# Patient Record
Sex: Female | Born: 1990 | Hispanic: Yes | Smoking: Never smoker
Health system: Southern US, Community
[De-identification: ages and names within clinical notes are randomized; demographics above are authoritative.]

## PROBLEM LIST (undated history)

## (undated) DIAGNOSIS — D649 Anemia, unspecified: Secondary | ICD-10-CM

## (undated) DIAGNOSIS — N840 Polyp of corpus uteri: Secondary | ICD-10-CM

## (undated) HISTORY — PX: NO PAST SURGERIES: SHX2092

---

## 2018-08-10 DIAGNOSIS — Z6834 Body mass index (BMI) 34.0-34.9, adult: Secondary | ICD-10-CM | POA: Diagnosis not present

## 2018-08-10 DIAGNOSIS — N926 Irregular menstruation, unspecified: Secondary | ICD-10-CM | POA: Diagnosis not present

## 2018-08-18 DIAGNOSIS — N939 Abnormal uterine and vaginal bleeding, unspecified: Secondary | ICD-10-CM | POA: Diagnosis not present

## 2018-08-18 DIAGNOSIS — R9389 Abnormal findings on diagnostic imaging of other specified body structures: Secondary | ICD-10-CM | POA: Diagnosis not present

## 2018-08-29 DIAGNOSIS — R9389 Abnormal findings on diagnostic imaging of other specified body structures: Secondary | ICD-10-CM | POA: Diagnosis not present

## 2018-08-29 DIAGNOSIS — Z3202 Encounter for pregnancy test, result negative: Secondary | ICD-10-CM | POA: Diagnosis not present

## 2019-05-16 ENCOUNTER — Other Ambulatory Visit: Payer: Self-pay | Admitting: Obstetrics and Gynecology

## 2019-05-16 DIAGNOSIS — D649 Anemia, unspecified: Secondary | ICD-10-CM | POA: Diagnosis not present

## 2019-05-30 ENCOUNTER — Other Ambulatory Visit (HOSPITAL_COMMUNITY)
Admission: RE | Admit: 2019-05-30 | Discharge: 2019-05-30 | Disposition: A | Discharge status: Home / Self Care | Payer: BC Managed Care – PPO | Source: Ambulatory Visit | Attending: Obstetrics and Gynecology | Admitting: Obstetrics and Gynecology

## 2019-05-30 DIAGNOSIS — Z01812 Encounter for preprocedural laboratory examination: Secondary | ICD-10-CM | POA: Insufficient documentation

## 2019-05-30 DIAGNOSIS — Z20828 Contact with and (suspected) exposure to other viral communicable diseases: Secondary | ICD-10-CM | POA: Diagnosis not present

## 2019-05-30 LAB — SARS CORONAVIRUS 2 (TAT 6-24 HRS): SARS Coronavirus 2: NEGATIVE

## 2019-06-01 ENCOUNTER — Other Ambulatory Visit: Payer: Self-pay

## 2019-06-01 ENCOUNTER — Encounter (HOSPITAL_BASED_OUTPATIENT_CLINIC_OR_DEPARTMENT_OTHER): Payer: Self-pay | Admitting: *Deleted

## 2019-06-01 NOTE — Progress Notes (Signed)
SPOKE WITH [place], NPO AFTER MIDNIGHT FOOD, CLEAR LIQUIDS FROM MIDNIGHT UNTIL 815 AM THEN NPO. NO MEDS TO TAKE. NEEDS CBC AND SERUM PREGNANCY. HAS SURGERY ORDERS IN Epic. 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

## 2019-06-02 ENCOUNTER — Encounter (HOSPITAL_BASED_OUTPATIENT_CLINIC_OR_DEPARTMENT_OTHER): Payer: Self-pay | Admitting: Anesthesiology

## 2019-06-02 ENCOUNTER — Ambulatory Visit (HOSPITAL_BASED_OUTPATIENT_CLINIC_OR_DEPARTMENT_OTHER): Payer: BC Managed Care – PPO | Admitting: Anesthesiology

## 2019-06-02 ENCOUNTER — Encounter (HOSPITAL_BASED_OUTPATIENT_CLINIC_OR_DEPARTMENT_OTHER)
Admission: RE | Disposition: A | Discharge status: Home / Self Care | Payer: Self-pay | Source: Home / Self Care | Attending: Obstetrics and Gynecology

## 2019-06-02 ENCOUNTER — Ambulatory Visit (HOSPITAL_BASED_OUTPATIENT_CLINIC_OR_DEPARTMENT_OTHER)
Admission: RE | Admit: 2019-06-02 | Discharge: 2019-06-02 | Disposition: A | Discharge status: Home / Self Care | Payer: BC Managed Care – PPO | Attending: Obstetrics and Gynecology | Admitting: Obstetrics and Gynecology

## 2019-06-02 DIAGNOSIS — N920 Excessive and frequent menstruation with regular cycle: Secondary | ICD-10-CM | POA: Diagnosis not present

## 2019-06-02 DIAGNOSIS — N840 Polyp of corpus uteri: Secondary | ICD-10-CM | POA: Diagnosis not present

## 2019-06-02 DIAGNOSIS — Z6836 Body mass index (BMI) 36.0-36.9, adult: Secondary | ICD-10-CM | POA: Insufficient documentation

## 2019-06-02 DIAGNOSIS — N921 Excessive and frequent menstruation with irregular cycle: Secondary | ICD-10-CM | POA: Insufficient documentation

## 2019-06-02 HISTORY — DX: Anemia, unspecified: D64.9

## 2019-06-02 HISTORY — DX: Polyp of corpus uteri: N84.0

## 2019-06-02 HISTORY — PX: DILATATION & CURETTAGE/HYSTEROSCOPY WITH MYOSURE: SHX6511

## 2019-06-02 LAB — CBC
HCT: 37.6 % (ref 36.0–46.0)
Hemoglobin: 11.8 g/dL — ABNORMAL LOW (ref 12.0–15.0)
MCH: 28.1 pg (ref 26.0–34.0)
MCHC: 31.4 g/dL (ref 30.0–36.0)
MCV: 89.5 fL (ref 80.0–100.0)
Platelets: 361 10*3/uL (ref 150–400)
RBC: 4.2 MIL/uL (ref 3.87–5.11)
RDW: 13.3 % (ref 11.5–15.5)
WBC: 7.9 10*3/uL (ref 4.0–10.5)
nRBC: 0 % (ref 0.0–0.2)

## 2019-06-02 LAB — HCG, SERUM, QUALITATIVE: Preg, Serum: NEGATIVE

## 2019-06-02 SURGERY — DILATATION & CURETTAGE/HYSTEROSCOPY WITH MYOSURE
Anesthesia: General

## 2019-06-02 MED ORDER — ACETAMINOPHEN 500 MG PO TABS
ORAL_TABLET | ORAL | Status: AC
  Filled 2019-06-02: qty 2

## 2019-06-02 MED ORDER — ONDANSETRON HCL 4 MG/2ML IJ SOLN
INTRAMUSCULAR | Status: DC | PRN
  Administered 2019-06-02: 4 mg via INTRAVENOUS

## 2019-06-02 MED ORDER — LIDOCAINE 2% (20 MG/ML) 5 ML SYRINGE
INTRAMUSCULAR | Status: AC
  Filled 2019-06-02: qty 5

## 2019-06-02 MED ORDER — LIDOCAINE 2% (20 MG/ML) 5 ML SYRINGE
INTRAMUSCULAR | Status: DC | PRN
  Administered 2019-06-02: 50 mg via INTRAVENOUS

## 2019-06-02 MED ORDER — KETOROLAC TROMETHAMINE 30 MG/ML IJ SOLN
INTRAMUSCULAR | Status: AC
  Filled 2019-06-02: qty 1

## 2019-06-02 MED ORDER — FENTANYL CITRATE (PF) 100 MCG/2ML IJ SOLN
INTRAMUSCULAR | Status: AC
  Filled 2019-06-02: qty 2

## 2019-06-02 MED ORDER — HYDROCODONE-ACETAMINOPHEN 5-325 MG PO TABS: 1.0000 | ORAL_TABLET | Freq: Four times a day (QID) | ORAL | 0 refills | Status: DC | PRN

## 2019-06-02 MED ORDER — WHITE PETROLATUM EX OINT
TOPICAL_OINTMENT | CUTANEOUS | Status: AC
  Filled 2019-06-02: qty 5

## 2019-06-02 MED ORDER — MIDAZOLAM HCL 5 MG/5ML IJ SOLN
INTRAMUSCULAR | Status: DC | PRN
  Administered 2019-06-02: 2 mg via INTRAVENOUS

## 2019-06-02 MED ORDER — PROMETHAZINE HCL 25 MG/ML IJ SOLN
6.2500 mg | INTRAMUSCULAR | Status: DC | PRN
  Filled 2019-06-02: qty 1

## 2019-06-02 MED ORDER — ONDANSETRON HCL 4 MG/2ML IJ SOLN
INTRAMUSCULAR | Status: AC
  Filled 2019-06-02: qty 2

## 2019-06-02 MED ORDER — KETOROLAC TROMETHAMINE 30 MG/ML IJ SOLN
INTRAMUSCULAR | Status: DC | PRN
  Administered 2019-06-02: 30 mg via INTRAVENOUS

## 2019-06-02 MED ORDER — DEXAMETHASONE SODIUM PHOSPHATE 4 MG/ML IJ SOLN
INTRAMUSCULAR | Status: DC | PRN
  Administered 2019-06-02: 10 mg via INTRAVENOUS

## 2019-06-02 MED ORDER — DEXAMETHASONE SODIUM PHOSPHATE 10 MG/ML IJ SOLN
INTRAMUSCULAR | Status: AC
  Filled 2019-06-02: qty 1

## 2019-06-02 MED ORDER — PROPOFOL 10 MG/ML IV BOLUS
INTRAVENOUS | Status: DC | PRN
  Administered 2019-06-02: 150 mg via INTRAVENOUS

## 2019-06-02 MED ORDER — MIDAZOLAM HCL 2 MG/2ML IJ SOLN
0.5000 mg | Freq: Once | INTRAMUSCULAR | Status: DC | PRN
  Filled 2019-06-02: qty 2

## 2019-06-02 MED ORDER — FENTANYL CITRATE (PF) 100 MCG/2ML IJ SOLN
INTRAMUSCULAR | Status: DC | PRN
  Administered 2019-06-02 (×2): 25 ug via INTRAVENOUS
  Administered 2019-06-02 (×2): 50 ug via INTRAVENOUS

## 2019-06-02 MED ORDER — PROPOFOL 10 MG/ML IV BOLUS
INTRAVENOUS | Status: AC
  Filled 2019-06-02: qty 20

## 2019-06-02 MED ORDER — MEPERIDINE HCL 25 MG/ML IJ SOLN
6.2500 mg | INTRAMUSCULAR | Status: DC | PRN
  Filled 2019-06-02: qty 1

## 2019-06-02 MED ORDER — MIDAZOLAM HCL 2 MG/2ML IJ SOLN
INTRAMUSCULAR | Status: AC
  Filled 2019-06-02: qty 2

## 2019-06-02 MED ORDER — SCOPOLAMINE 1 MG/3DAYS TD PT72
MEDICATED_PATCH | TRANSDERMAL | Status: AC
  Filled 2019-06-02: qty 1

## 2019-06-02 MED ORDER — FENTANYL CITRATE (PF) 100 MCG/2ML IJ SOLN
25.0000 ug | INTRAMUSCULAR | Status: DC | PRN
  Filled 2019-06-02: qty 1

## 2019-06-02 MED ORDER — SCOPOLAMINE 1 MG/3DAYS TD PT72
1.0000 | MEDICATED_PATCH | TRANSDERMAL | Status: DC
  Administered 2019-06-02: 14:00:00 1.5 mg via TRANSDERMAL
  Filled 2019-06-02: qty 1

## 2019-06-02 MED ORDER — ACETAMINOPHEN 500 MG PO TABS
1000.0000 mg | ORAL_TABLET | Freq: Once | ORAL | Status: AC
  Administered 2019-06-02: 1000 mg via ORAL
  Filled 2019-06-02: qty 2

## 2019-06-02 MED ORDER — LACTATED RINGERS IV SOLN
INTRAVENOUS | Status: DC
  Administered 2019-06-02: 13:00:00 via INTRAVENOUS
  Filled 2019-06-02: qty 1000

## 2019-06-02 MED ORDER — SODIUM CHLORIDE 0.9 % IR SOLN
Status: DC | PRN
  Administered 2019-06-02: 3000 mL

## 2019-06-02 MED ORDER — IBUPROFEN 800 MG PO TABS: 800.0000 mg | ORAL_TABLET | Freq: Three times a day (TID) | ORAL | 0 refills | Status: DC | PRN

## 2019-06-02 SURGICAL SUPPLY — 14 items
CATH ROBINSON RED A/P 16FR (CATHETERS) ×3 IMPLANT
DEVICE MYOSURE LITE (MISCELLANEOUS) ×2 IMPLANT
DEVICE MYOSURE REACH (MISCELLANEOUS) IMPLANT
GLOVE BIOGEL PI IND STRL 6.5 (GLOVE) ×1 IMPLANT
GLOVE BIOGEL PI INDICATOR 6.5 (GLOVE) ×2
GLOVE ECLIPSE 6.5 STRL STRAW (GLOVE) ×3 IMPLANT
GOWN STRL REUS W/TWL LRG LVL3 (GOWN DISPOSABLE) ×6 IMPLANT
HIBICLENS CHG 4% 4OZ BTL (MISCELLANEOUS) IMPLANT
KIT PROCEDURE FLUENT (KITS) ×3 IMPLANT
PACK VAGINAL MINOR WOMEN LF (CUSTOM PROCEDURE TRAY) ×3 IMPLANT
PAD OB MATERNITY 4.3X12.25 (PERSONAL CARE ITEMS) ×3 IMPLANT
SEAL CERVICAL OMNI LOK (ABLATOR) IMPLANT
SEAL ROD LENS SCOPE MYOSURE (ABLATOR) ×3 IMPLANT
TOWEL OR 17X26 10 PK STRL BLUE (TOWEL DISPOSABLE) ×3 IMPLANT

## 2019-06-02 NOTE — Discharge Instructions (Signed)

## 2019-06-02 NOTE — Op Note (Signed)
Preoperative diagnosis: Menometrorrhagia, endometrial polyp  Postop diagnosis: as above.  Procedure: Hysteroscopic polypectomy with Jacklynn Barnacle, D&C Anesthesia General via LMA  Surgeon: Tiana Loft, MD  Assistant:none IV fluids : 856ml Estimated blood loss : 61ml Urine output: straight catheter preop : 170ml clear urine Complications none  Condition stable  Disposition PACU  Specimen: endometrial polyp with endometrial curettings   Procedure  Indication: Menometrorhagia. Office sono noted endometrial polyp. Patient was counseled on risks/ complications including infection, bleeding, damage to internal organs, she understood and agrees, gave informed written consent.  Patient was brought to the operating room with IV running. Time out was carried out.  She underwent general anesthesia via LMA without complications. She was given dorsolithotomy position. Parts were prepped and draped in standard fashion. Bladder was catheterized once. Bimanual exam revealed uterus to be retroverted and normal size. Speculum was placed and cervix was grasped with single-tooth tenaculum. The uterus was sounded to 8 cm. Cervical os was dilated to 17 [place]. Hysteroscope was introduced in the uterine cavity under vision.  Findings: ?small polyp, diffusely thickened endometrium The hysterscope was then removed and sharp curettage performed.  The hysterscope was then replaced and the cavity was more normal appearing but still with slighty thickened areas.  The myosure was then used to sample these areas and help clear the cavity.  The cavity had normal appearance after use of myosure. Hysteroscope/myosure was removed.  All tissue sent to path.  Fluid deficit 175 cc.  All counts are correct x2. No complications. Patient was made supine dorsal anesthesia and brought to the recovery room in stable condition.  Patient will be discharged home today. Discharge with norco #5 and ibuprofen. Follow up in 2 weeks in office. Warning  signs of infection and excessive bleeding reviewed.

## 2019-06-02 NOTE — H&P (Signed)
Marisa May is an 28 y.o. female. Here for D&C, hysteroscopy, myosure polypectomy.  H/o aub and endometrial polyp noted at [facility].  She is currently having light bleeding.  Pertinent Gynecological History: See above  Menstrual History:  Patient's last menstrual period was 04/30/2019.    Past Medical History:  Diagnosis Date  . Anemia    YRS AGO  . Endometrial polyp     Past Surgical History:  Procedure Laterality Date  . NO PAST SURGERIES      History reviewed. No pertinent family history.  Social History:  reports that she has never smoked. She has never used smokeless tobacco. She reports that she does not drink alcohol or use drugs.  Allergies: No Known Allergies  Medications Prior to Admission  Medication Sig Dispense Refill Last Dose  . UNABLE TO FIND HORMONE ESTRYLLA 0.25 MG DAILY IN AM   06/01/2019 at Unknown time    ROS SOB/chest pain/ HA/ vision changes/ LE pain or swelling/ etc.  Blood pressure 124/73, pulse 77, temperature 98 F (36.7 C), temperature source Oral, resp. rate 16, height 5' (1.524 m), weight 85.5 kg, last menstrual period 04/30/2019, SpO2 100 %. Physical Exam A&O x 3 HEENT : grossly wnl Lungs : ctab CV  : rrr Abdo : soft, nt, nd Extr : no edema, nt bilat Pelvic : deferred   Results for orders placed or performed during the hospital encounter of 06/02/19 (from the past 24 hour(s))  CBC     Status: Abnormal   Collection Time: 06/02/19 12:23 PM  Result Value Ref Range   WBC 7.9 4.0 - 10.5 K/uL   RBC 4.20 3.87 - 5.11 MIL/uL   Hemoglobin 11.8 (L) 12.0 - 15.0 g/dL   HCT 37.6 36.0 - 46.0 %   MCV 89.5 80.0 - 100.0 fL   MCH 28.1 26.0 - 34.0 pg   MCHC 31.4 30.0 - 36.0 g/dL   RDW 13.3 11.5 - 15.5 %   Platelets 361 150 - 400 K/uL   nRBC 0.0 0.0 - 0.2 %  hCG, serum, qualitative     Status: None   Collection Time: 06/02/19 12:23 PM  Result Value Ref Range   Preg, Serum NEGATIVE NEGATIVE    No results found.  Assessment/Plan: 28 y/o  with endometrial polyp. 1. Proceed to OR for planned procedure; pt understands risk of bleeding, infection, uterine perforation, risk of further surgery, risk of anesthesia, risk of blood clot to lung/leg.  Consent signed and agrees to proceed.  Pt to contin current ocp daily.  Charyl Bigger 06/02/2019, 2:49 PM

## 2019-06-02 NOTE — Anesthesia Preprocedure Evaluation (Addendum)
Anesthesia Evaluation  Patient identified by MRN, date of birth, ID band Patient awake    Reviewed: Allergy & Precautions, NPO status , Patient's Chart, lab work & pertinent test results  History of Anesthesia Complications Negative for: history of anesthetic complications  Airway Mallampati: I  TM Distance: >3 FB Neck ROM: Full    Dental  (+) Dental Advisory Given   Pulmonary neg pulmonary ROS,  05/30/2019 SARS coronavirus NEG   breath sounds clear to auscultation       Cardiovascular (-) anginanegative cardio ROS   Rhythm:Regular Rate:Normal     Neuro/Psych negative neurological ROS     GI/Hepatic negative GI ROS, Neg liver ROS,   Endo/Other  Morbid obesity  Renal/GU negative Renal ROS     Musculoskeletal   Abdominal (+) + obese,   Peds  Hematology negative hematology ROS (+)   Anesthesia Other Findings   Reproductive/Obstetrics                            Anesthesia Physical Anesthesia Plan  ASA: I  Anesthesia Plan: General   Post-op Pain Management:    Induction: Intravenous  PONV Risk Score and Plan: 3 and Ondansetron, Dexamethasone and Scopolamine patch - Pre-op  Airway Management Planned: LMA  Additional Equipment:   Intra-op Plan:   Post-operative Plan:   Informed Consent: I have reviewed the patients History and Physical, chart, labs and discussed the procedure including the risks, benefits and alternatives for the proposed anesthesia with the patient or authorized representative who has indicated his/her understanding and acceptance.     Dental advisory given  Plan Discussed with: CRNA and Surgeon  Anesthesia Plan Comments:         Anesthesia Quick Evaluation

## 2019-06-02 NOTE — Anesthesia Postprocedure Evaluation (Signed)
Anesthesia Post Note  Patient: Marisa May  Procedure(s) Performed: DILATATION & CURETTAGE/HYSTEROSCOPY WITH MYOSURE (N/A )     Patient location during evaluation: PACU Anesthesia Type: General Level of consciousness: awake and alert, patient cooperative and oriented Pain management: pain level controlled Vital Signs Assessment: post-procedure vital signs reviewed and stable Respiratory status: spontaneous breathing, nonlabored ventilation and respiratory function stable Cardiovascular status: blood pressure returned to baseline and stable Postop Assessment: no apparent nausea or vomiting Anesthetic complications: no    Last Vitals:  Vitals:   06/02/19 1603 06/02/19 1630  BP: 139/81 135/78  Pulse: 91 78  Resp:  17  Temp: 37 C   SpO2: 98% 99%    Last Pain:  Vitals:   06/02/19 1630  TempSrc:   PainSc: 0-No pain                 Londynn Sonoda,E. Harutyun Monteverde

## 2019-06-02 NOTE — Anesthesia Procedure Notes (Signed)
Procedure Name: LMA Insertion Date/Time: 06/02/2019 3:05 PM Performed by: Suan Halter, CRNA Pre-anesthesia Checklist: Patient identified, Emergency Drugs available, Suction available and Patient being monitored Patient Re-evaluated:Patient Re-evaluated prior to induction Oxygen Delivery Method: Circle system utilized Preoxygenation: Pre-oxygenation with 100% oxygen Induction Type: IV induction Ventilation: Mask ventilation without difficulty LMA: LMA inserted LMA Size: 4.0 Number of attempts: 1 Airway Equipment and Method: Bite block Placement Confirmation: positive ETCO2 Tube secured with: Tape Dental Injury: Teeth and Oropharynx as per pre-operative assessment

## 2019-06-02 NOTE — Transfer of Care (Signed)
Immediate Anesthesia Transfer of Care Note  Patient: Marisa May  Procedure(s) Performed: Procedure(s) (LRB): DILATATION & CURETTAGE/HYSTEROSCOPY WITH MYOSURE (N/A)  Patient Location: PACU  Anesthesia Type: General  Level of Consciousness: awake, oriented, sedated and patient cooperative  Airway & Oxygen Therapy: Patient Spontanous Breathing and Patient connected to face mask oxygen  Post-op Assessment: Report given to PACU RN and Post -op Vital signs reviewed and stable  Post vital signs: Reviewed and stable  Complications: No apparent anesthesia complications  Last Vitals:  Vitals Value Taken Time  BP 139/81 06/02/19 1603  Temp    Pulse 100 06/02/19 1607  Resp 14 06/02/19 1607  SpO2 100 % 06/02/19 1607  Vitals shown include unvalidated device data.  Last Pain:  Vitals:   06/02/19 1220  TempSrc: Oral  PainSc: 0-No pain      Patients Stated Pain Goal: 7 (06/02/19 1220)

## 2019-06-05 ENCOUNTER — Encounter (HOSPITAL_BASED_OUTPATIENT_CLINIC_OR_DEPARTMENT_OTHER): Payer: Self-pay | Admitting: Obstetrics and Gynecology

## 2019-06-13 ENCOUNTER — Other Ambulatory Visit: Payer: Self-pay | Admitting: *Deleted

## 2019-06-13 DIAGNOSIS — R6889 Other general symptoms and signs: Secondary | ICD-10-CM | POA: Diagnosis not present

## 2019-06-13 DIAGNOSIS — Z20822 Contact with and (suspected) exposure to covid-19: Secondary | ICD-10-CM

## 2019-06-15 LAB — NOVEL CORONAVIRUS, NAA: SARS-CoV-2, NAA: NOT DETECTED

## 2019-06-16 DIAGNOSIS — N840 Polyp of corpus uteri: Secondary | ICD-10-CM | POA: Diagnosis not present

## 2019-09-22 ENCOUNTER — Emergency Department (HOSPITAL_BASED_OUTPATIENT_CLINIC_OR_DEPARTMENT_OTHER)
Admission: EM | Admit: 2019-09-22 | Discharge: 2019-09-22 | Disposition: A | Discharge status: Home / Self Care | Payer: BC Managed Care – PPO | Attending: Emergency Medicine | Admitting: Emergency Medicine

## 2019-09-22 ENCOUNTER — Emergency Department (HOSPITAL_BASED_OUTPATIENT_CLINIC_OR_DEPARTMENT_OTHER)
Admit: 2019-09-22 | Discharge: 2019-09-22 | Disposition: A | Discharge status: Home / Self Care | Payer: BC Managed Care – PPO | Attending: Emergency Medicine | Admitting: Emergency Medicine

## 2019-09-22 ENCOUNTER — Other Ambulatory Visit: Payer: Self-pay

## 2019-09-22 ENCOUNTER — Encounter (HOSPITAL_BASED_OUTPATIENT_CLINIC_OR_DEPARTMENT_OTHER): Payer: Self-pay | Admitting: Emergency Medicine

## 2019-09-22 DIAGNOSIS — R1011 Right upper quadrant pain: Secondary | ICD-10-CM | POA: Diagnosis not present

## 2019-09-22 DIAGNOSIS — K8020 Calculus of gallbladder without cholecystitis without obstruction: Secondary | ICD-10-CM | POA: Diagnosis not present

## 2019-09-22 LAB — COMPREHENSIVE METABOLIC PANEL
ALT: 22 U/L (ref 0–44)
AST: 28 U/L (ref 15–41)
Albumin: 4.2 g/dL (ref 3.5–5.0)
Alkaline Phosphatase: 79 U/L (ref 38–126)
Anion gap: 9 (ref 5–15)
BUN: 13 mg/dL (ref 6–20)
CO2: 25 mmol/L (ref 22–32)
Calcium: 9.5 mg/dL (ref 8.9–10.3)
Chloride: 104 mmol/L (ref 98–111)
Creatinine, Ser: 0.73 mg/dL (ref 0.44–1.00)
GFR calc Af Amer: >60 mL/min (ref >60)
GFR calc non Af Amer: >60 mL/min (ref >60)
Glucose, Bld: 109 mg/dL — ABNORMAL HIGH (ref 70–99)
Potassium: 3.9 mmol/L (ref 3.5–5.1)
Sodium: 138 mmol/L (ref 135–145)
Total Bilirubin: 0.2 mg/dL — ABNORMAL LOW (ref 0.3–1.2)
Total Protein: 7.5 g/dL (ref 6.5–8.1)

## 2019-09-22 LAB — CBC WITH DIFFERENTIAL/PLATELET
Abs Immature Granulocytes: 0.02 10*3/uL (ref 0.00–0.07)
Basophils Absolute: 0.1 10*3/uL (ref 0.0–0.1)
Basophils Relative: 1 %
Eosinophils Absolute: 0.2 10*3/uL (ref 0.0–0.5)
Eosinophils Relative: 2 %
HCT: 39.8 % (ref 36.0–46.0)
Hemoglobin: 12.6 g/dL (ref 12.0–15.0)
Immature Granulocytes: 0 %
Lymphocytes Relative: 27 %
Lymphs Abs: 2.6 10*3/uL (ref 0.7–4.0)
MCH: 26.9 pg (ref 26.0–34.0)
MCHC: 31.7 g/dL (ref 30.0–36.0)
MCV: 84.9 fL (ref 80.0–100.0)
Monocytes Absolute: 0.6 10*3/uL (ref 0.1–1.0)
Monocytes Relative: 6 %
Neutro Abs: 6.2 10*3/uL (ref 1.7–7.7)
Neutrophils Relative %: 64 %
Platelets: 311 10*3/uL (ref 150–400)
RBC: 4.69 MIL/uL (ref 3.87–5.11)
RDW: 13.8 % (ref 11.5–15.5)
WBC: 9.7 10*3/uL (ref 4.0–10.5)
nRBC: 0 % (ref 0.0–0.2)

## 2019-09-22 LAB — URINALYSIS, MICROSCOPIC (REFLEX)

## 2019-09-22 LAB — URINALYSIS, ROUTINE W REFLEX MICROSCOPIC
Bilirubin Urine: NEGATIVE
Glucose, UA: NEGATIVE mg/dL
Ketones, ur: NEGATIVE mg/dL
Leukocytes,Ua: NEGATIVE
Nitrite: NEGATIVE
Protein, ur: NEGATIVE mg/dL
Specific Gravity, Urine: >1.03 — ABNORMAL HIGH (ref 1.005–1.030)
pH: 6 (ref 5.0–8.0)

## 2019-09-22 LAB — LIPASE, BLOOD: Lipase: 26 U/L (ref 11–51)

## 2019-09-22 LAB — PREGNANCY, URINE: Preg Test, Ur: NEGATIVE

## 2019-09-22 MED ORDER — ONDANSETRON HCL 4 MG/2ML IJ SOLN
4.0000 mg | Freq: Once | INTRAMUSCULAR | Status: AC
  Administered 2019-09-22: 03:00:00 4 mg via INTRAVENOUS
  Filled 2019-09-22: qty 2

## 2019-09-22 MED ORDER — FENTANYL CITRATE (PF) 100 MCG/2ML IJ SOLN
100.0000 ug | Freq: Once | INTRAMUSCULAR | Status: AC
  Administered 2019-09-22: 100 ug via INTRAVENOUS
  Filled 2019-09-22: qty 2

## 2019-09-22 MED ORDER — GUAIFENESIN 100 MG/5ML PO SOLN: 20.0000 mL | Freq: Once | ORAL | Status: DC

## 2019-09-22 NOTE — ED Notes (Signed)
ED Provider at bedside. [place] MD. 

## 2019-09-22 NOTE — ED Notes (Signed)
Pt's ride here for pt.  Pt okay for [place] appointment at 0800.

## 2019-09-22 NOTE — ED Provider Notes (Signed)
[facility] Provider Note: Marisa Dell, MD, FACEP  CSN: [id number] MRN: [id number] ARRIVAL: 09/22/19 at 0203 ROOM: MH10/MH10   CHIEF COMPLAINT  Abdominal Pain   HISTORY OF PRESENT ILLNESS  09/22/19 4:55 AM Marisa May is a 28 y.o. female who has had 4 episodes of right upper quadrant pain in the past month.  These episodes usually wake her from sleep and resolve by morning.  She is here with her latest episode which woke her up about 11 PM yesterday evening.  The pain is been persistent and she rates it as an 8 out of 10.  Pain is worse with certain positions and movement.  There has been no associated fever, chills, nausea, vomiting or diarrhea.   Past Medical History:  Diagnosis Date  . Anemia    YRS AGO  . Endometrial polyp     Past Surgical History:  Procedure Laterality Date  . DILATATION & CURETTAGE/HYSTEROSCOPY WITH MYOSURE N/A 06/02/2019   Procedure: DILATATION & CURETTAGE/HYSTEROSCOPY WITH MYOSURE;  Surgeon: Vick Frees, MD;  Location: [facility];  Service: Gynecology;  Laterality: N/A;  . NO PAST SURGERIES      No family history on file.  Social History   Tobacco Use  . Smoking status: Never Smoker  . Smokeless tobacco: Never Used  Substance Use Topics  . Alcohol use: Never  . Drug use: Never    Prior to Admission medications   Not on File    Allergies Patient has no known allergies.   REVIEW OF SYSTEMS  Negative except as noted here or in the History of Present Illness.   PHYSICAL EXAMINATION  Initial Vital Signs Blood pressure 128/90, pulse 73, temperature (!) 97.5 F (36.4 C), temperature source Oral, resp. rate 20, height 5' (1.524 m), weight 84.4 kg, last menstrual period 09/03/2019, SpO2 99 %.  Examination General: Well-developed, well-nourished female in no acute distress; appearance consistent with age of record HENT: normocephalic; atraumatic Eyes: pupils equal, round and reactive to light;  extraocular muscles intact Neck: supple Heart: regular rate and rhythm Lungs: clear to auscultation bilaterally Abdomen: soft; nondistended; right upper quadrant tenderness; bowel sounds present; bedside ultrasound suggest small gallstone in neck of gallbladder:    Extremities: No deformity; full range of motion; pulses normal Neurologic: Awake, alert and oriented; motor function intact in all extremities and symmetric; no facial droop Skin: Warm and dry Psychiatric: Normal mood and affect   RESULTS  Summary of this visit's results, reviewed and interpreted by myself:   EKG Interpretation  Date/Time:    Ventricular Rate:    PR Interval:    QRS Duration:   QT Interval:    QTC Calculation:   R Axis:     Text Interpretation:        Laboratory Studies: Results for orders placed or performed during the hospital encounter of 09/22/19 (from the past 24 hour(s))  Urinalysis, Routine w reflex microscopic     Status: Abnormal   Collection Time: 09/22/19  2:32 AM  Result Value Ref Range   Color, Urine YELLOW YELLOW   APPearance HAZY (A) CLEAR   Specific Gravity, Urine >1.030 (H) 1.005 - 1.030   pH 6.0 5.0 - 8.0   Glucose, UA NEGATIVE NEGATIVE mg/dL   Hgb urine dipstick TRACE (A) NEGATIVE   Bilirubin Urine NEGATIVE NEGATIVE   Ketones, ur NEGATIVE NEGATIVE mg/dL   Protein, ur NEGATIVE NEGATIVE mg/dL   Nitrite NEGATIVE NEGATIVE   Leukocytes,Ua NEGATIVE NEGATIVE  Pregnancy, urine  Status: None   Collection Time: 09/22/19  2:32 AM  Result Value Ref Range   Preg Test, Ur NEGATIVE NEGATIVE  Urinalysis, Microscopic (reflex)     Status: Abnormal   Collection Time: 09/22/19  2:32 AM  Result Value Ref Range   RBC / HPF 0-5 0 - 5 RBC/hpf   WBC, UA 0-5 0 - 5 WBC/hpf   Bacteria, UA MANY (A) NONE SEEN   Squamous Epithelial / LPF 6-10 0 - 5  CBC with Differential     Status: None   Collection Time: 09/22/19  2:35 AM  Result Value Ref Range   WBC 9.7 4.0 - 10.5 K/uL   RBC 4.69  3.87 - 5.11 MIL/uL   Hemoglobin 12.6 12.0 - 15.0 g/dL   HCT 16.139.8 09.636.0 - 04.546.0 %   MCV 84.9 80.0 - 100.0 fL   MCH 26.9 26.0 - 34.0 pg   MCHC 31.7 30.0 - 36.0 g/dL   RDW 40.913.8 81.111.5 - 91.415.5 %   Platelets 311 150 - 400 K/uL   nRBC 0.0 0.0 - 0.2 %   Neutrophils Relative % 64 %   Neutro Abs 6.2 1.7 - 7.7 K/uL   Lymphocytes Relative 27 %   Lymphs Abs 2.6 0.7 - 4.0 K/uL   Monocytes Relative 6 %   Monocytes Absolute 0.6 0.1 - 1.0 K/uL   Eosinophils Relative 2 %   Eosinophils Absolute 0.2 0.0 - 0.5 K/uL   Basophils Relative 1 %   Basophils Absolute 0.1 0.0 - 0.1 K/uL   Immature Granulocytes 0 %   Abs Immature Granulocytes 0.02 0.00 - 0.07 K/uL  Comprehensive metabolic panel     Status: Abnormal   Collection Time: 09/22/19  2:35 AM  Result Value Ref Range   Sodium 138 135 - 145 mmol/L   Potassium 3.9 3.5 - 5.1 mmol/L   Chloride 104 98 - 111 mmol/L   CO2 25 22 - 32 mmol/L   Glucose, Bld 109 (H) 70 - 99 mg/dL   BUN 13 6 - 20 mg/dL   Creatinine, Ser 7.820.73 0.44 - 1.00 mg/dL   Calcium 9.5 8.9 - 95.610.3 mg/dL   Total Protein 7.5 6.5 - 8.1 g/dL   Albumin 4.2 3.5 - 5.0 g/dL   AST 28 15 - 41 U/L   ALT 22 0 - 44 U/L   Alkaline Phosphatase 79 38 - 126 U/L   Total Bilirubin 0.2 (L) 0.3 - 1.2 mg/dL   GFR calc non Af Amer >60 >60 mL/min   GFR calc Af Amer >60 >60 mL/min   Anion gap 9 5 - 15  Lipase, blood     Status: None   Collection Time: 09/22/19  2:35 AM  Result Value Ref Range   Lipase 26 11 - 51 U/L   Imaging Studies: No results found.  ED COURSE and MDM  Nursing notes, initial and subsequent vitals signs, including pulse oximetry, reviewed and interpreted by myself.  Vitals:   09/22/19 0210 09/22/19 0211 09/22/19 0255  BP: 128/90  105/82  Pulse: 73  62  Resp: 20  18  Temp: (!) 97.5 F (36.4 C)    TempSrc: Oral    SpO2: 96%  100%  Weight:  84.4 kg   Height:  5' (1.524 m)    Medications  ondansetron (ZOFRAN) injection 4 mg (4 mg Intravenous Given 09/22/19 0249)  fentaNYL  (SUBLIMAZE) injection 100 mcg (100 mcg Intravenous Given 09/22/19 0249)   3:44 AM Patient's pain and tenderness significantly improved.  We will continue to observe.  If pain resolves we will have the patient return for a formal ultrasound later this morning.  4:53 AM Patient continues to feel better.  Abdomen soft and minimally tender now.  I suspect biliary colic.  We will have her return later this morning for a formal ultrasound.  PROCEDURES  Procedures   ED DIAGNOSES     ICD-10-CM   1. Right upper quadrant abdominal pain  R10.11        Jaydrian Corpening, Jenny Reichmann, MD 09/22/19 [phone]

## 2019-09-22 NOTE — Discharge Instructions (Addendum)
Contact a health care provider if: Your pain is not controlled with medicine. You have a fever. Get help right away if: Your pain moves to another part of your abdomen or to your back. You continue to have symptoms or you develop new symptoms even with treatment.

## 2019-09-22 NOTE — ED Triage Notes (Signed)
Pt states RUQ pain.

## 2019-09-22 NOTE — ED Triage Notes (Signed)
Right flank pain off and on for 1 month increased last few hours.

## 2019-09-27 DIAGNOSIS — K8010 Calculus of gallbladder with chronic cholecystitis without obstruction: Secondary | ICD-10-CM | POA: Diagnosis not present

## 2019-10-06 HISTORY — PX: CHOLECYSTECTOMY: SHX55

## 2019-11-03 DIAGNOSIS — Z01818 Encounter for other preprocedural examination: Secondary | ICD-10-CM | POA: Diagnosis not present

## 2019-11-09 ENCOUNTER — Other Ambulatory Visit: Payer: Self-pay | Admitting: General Surgery

## 2019-11-09 DIAGNOSIS — K812 Acute cholecystitis with chronic cholecystitis: Secondary | ICD-10-CM | POA: Diagnosis not present

## 2019-11-09 DIAGNOSIS — K8010 Calculus of gallbladder with chronic cholecystitis without obstruction: Secondary | ICD-10-CM | POA: Diagnosis not present

## 2020-01-01 DIAGNOSIS — L400 Psoriasis vulgaris: Secondary | ICD-10-CM | POA: Diagnosis not present

## 2020-01-30 DIAGNOSIS — L2089 Other atopic dermatitis: Secondary | ICD-10-CM | POA: Diagnosis not present

## 2020-01-30 DIAGNOSIS — L400 Psoriasis vulgaris: Secondary | ICD-10-CM | POA: Diagnosis not present

## 2020-01-30 DIAGNOSIS — L810 Postinflammatory hyperpigmentation: Secondary | ICD-10-CM | POA: Diagnosis not present

## 2020-02-02 DIAGNOSIS — Z130 Encounter for screening for diseases of the blood and blood-forming organs and certain disorders involving the immune mechanism: Secondary | ICD-10-CM | POA: Diagnosis not present

## 2020-02-02 DIAGNOSIS — Z13220 Encounter for screening for lipoid disorders: Secondary | ICD-10-CM | POA: Diagnosis not present

## 2020-02-02 DIAGNOSIS — Z1329 Encounter for screening for other suspected endocrine disorder: Secondary | ICD-10-CM | POA: Diagnosis not present

## 2020-02-02 DIAGNOSIS — Z0000 Encounter for general adult medical examination without abnormal findings: Secondary | ICD-10-CM | POA: Diagnosis not present

## 2020-02-02 DIAGNOSIS — Z01419 Encounter for gynecological examination (general) (routine) without abnormal findings: Secondary | ICD-10-CM | POA: Diagnosis not present

## 2020-02-02 DIAGNOSIS — Z131 Encounter for screening for diabetes mellitus: Secondary | ICD-10-CM | POA: Diagnosis not present

## 2020-03-05 ENCOUNTER — Encounter (HOSPITAL_COMMUNITY): Payer: Self-pay

## 2020-03-05 ENCOUNTER — Emergency Department (HOSPITAL_COMMUNITY)
Admission: EM | Admit: 2020-03-05 | Discharge: 2020-03-05 | Disposition: A | Discharge status: Home / Self Care | Payer: BC Managed Care – PPO | Attending: Emergency Medicine | Admitting: Emergency Medicine

## 2020-03-05 ENCOUNTER — Emergency Department (HOSPITAL_COMMUNITY): Payer: BC Managed Care – PPO

## 2020-03-05 ENCOUNTER — Other Ambulatory Visit: Payer: Self-pay

## 2020-03-05 DIAGNOSIS — R519 Headache, unspecified: Secondary | ICD-10-CM | POA: Insufficient documentation

## 2020-03-05 DIAGNOSIS — R200 Anesthesia of skin: Secondary | ICD-10-CM | POA: Diagnosis not present

## 2020-03-05 DIAGNOSIS — G510 Bell's palsy: Secondary | ICD-10-CM | POA: Insufficient documentation

## 2020-03-05 DIAGNOSIS — R202 Paresthesia of skin: Secondary | ICD-10-CM | POA: Diagnosis not present

## 2020-03-05 MED ORDER — PREDNISONE 20 MG PO TABS: 20.0000 mg | ORAL_TABLET | Freq: Two times a day (BID) | ORAL | 0 refills | Status: DC

## 2020-03-05 MED ORDER — PREDNISONE 20 MG PO TABS
60.0000 mg | ORAL_TABLET | Freq: Once | ORAL | Status: AC
  Administered 2020-03-05: 60 mg via ORAL
  Filled 2020-03-05: qty 3

## 2020-03-05 NOTE — ED Triage Notes (Signed)
Patient arrived stating that four days ago she had numbness in tongue. Two nights ago she developed "heaviness" to the right side of her mouth. And is feeling numb on the left side of her face, reports difficulty closing her left eye. Patient declines any falls or injury. Smile asymmetric in triage. No difficulty ambulating or speaking.

## 2020-03-05 NOTE — Discharge Instructions (Addendum)
Your symptoms are related to a condition called Bell's palsy.  This usually improves with anti-inflammatory treatment, prednisone.  If you have trouble closing your left eye, use some eyedrops such as Lacri-Lube, and taping your eye shut at nighttime to prevent injury.  You will likely improve within the next week or so.  If you have persistent symptoms follow-up with your primary care doctor or see a neurologist.

## 2020-03-05 NOTE — ED Provider Notes (Signed)
[facility]-EMERGENCY DEPT Provider Note   CSN: [id number] Arrival date & time: 03/05/20  1818     History Chief Complaint  Patient presents with   Numbness    Facial     Marisa May is a 29 y.o. female.  HPI She presents for evaluation of weakness of the left face and numbness of the left tongue, for 3 days.  She denies right facial symptoms.  She is able to eat and states that food taste normal.  No head injury.  No prior similar symptoms.  No fever, chills, vomiting, dizziness, shortness of breath or chest pain.  There are no other known modifying factors.    Past Medical History:  Diagnosis Date   Anemia    YRS AGO   Endometrial polyp     There are no problems to display for this patient.   Past Surgical History:  Procedure Laterality Date   DILATATION & CURETTAGE/HYSTEROSCOPY WITH MYOSURE N/A 06/02/2019   Procedure: DILATATION & CURETTAGE/HYSTEROSCOPY WITH MYOSURE;  Surgeon: Vick Frees, MD;  Location: [facility];  Service: Gynecology;  Laterality: N/A;   NO PAST SURGERIES       OB History   No obstetric history on file.     No family history on file.  Social History   Tobacco Use   Smoking status: Never Smoker   Smokeless tobacco: Never Used  Substance Use Topics   Alcohol use: Never   Drug use: Never    Home Medications Prior to Admission medications   Medication Sig Start Date End Date Taking? Authorizing Provider  predniSONE (DELTASONE) 20 MG tablet Take 1 tablet (20 mg total) by mouth 2 (two) times daily. 03/05/20   Mancel Bale, MD    Allergies    Patient has no known allergies.  Review of Systems   Review of Systems  All other systems reviewed and are negative.   Physical Exam Updated Vital Signs BP (!) 154/83 (BP Location: Right Arm)    Pulse 69    Temp 98.2 F (36.8 C) (Oral)    Resp 16    Ht 5' (1.524 m)    Wt 81.2 kg    LMP 02/16/2020    SpO2 100%    BMI 34.96 kg/m    Physical Exam Vitals and nursing note reviewed.  Constitutional:      General: She is not in acute distress.    Appearance: She is well-developed. She is not ill-appearing, toxic-appearing or diaphoretic.  HENT:     Head: Normocephalic and atraumatic.     Right Ear: External ear normal.     Left Ear: External ear normal.  Eyes:     Conjunctiva/sclera: Conjunctivae normal.     Pupils: Pupils are equal, round, and reactive to light.  Neck:     Trachea: Phonation normal.  Cardiovascular:     Rate and Rhythm: Normal rate.  Pulmonary:     Effort: Pulmonary effort is normal.  Abdominal:     General: There is no distension.  Musculoskeletal:        General: Normal range of motion.     Cervical back: Normal range of motion and neck supple.  Skin:    General: Skin is warm and dry.  Neurological:     Mental Status: She is alert and oriented to person, place, and time.     Motor: No abnormal muscle tone.     Coordination: Coordination normal.     Comments: No dysarthria or  aphasia.  No nystagmus.  Left midface weakness.  She is able to close her left eye completely.  No other cranial nerve abnormality, with objective testing.  Psychiatric:        Mood and Affect: Mood normal.        Behavior: Behavior normal.        Thought Content: Thought content normal.        Judgment: Judgment normal.     ED Results / Procedures / Treatments   Labs (all labs ordered are listed, but only abnormal results are displayed) Labs Reviewed - No data to display  EKG None  Radiology CT Head Wo Contrast  Result Date: 03/05/2020 CLINICAL DATA:  Tongue numbness for several days EXAM: CT HEAD WITHOUT CONTRAST TECHNIQUE: Contiguous axial images were obtained from the base of the skull through the vertex without intravenous contrast. COMPARISON:  None. FINDINGS: Brain: No evidence of acute infarction, hemorrhage, hydrocephalus, extra-axial collection or mass lesion/mass effect. Vascular: No hyperdense  vessel or unexpected calcification. Skull: Normal. Negative for fracture or focal lesion. Sinuses/Orbits: No acute finding. Other: None. IMPRESSION: No acute intracranial abnormality noted. Electronically Signed   By: Inez Catalina M.D.   On: 03/05/2020 21:03    Procedures Procedures (including critical care time)  Medications Ordered in ED Medications  predniSONE (DELTASONE) tablet 60 mg (60 mg Oral Given 03/05/20 2147)    ED Course  I have reviewed the triage vital signs and the nursing notes.  Pertinent labs & imaging results that were available during my care of the patient were reviewed by me and considered in my medical decision making (see chart for details).    MDM Rules/Calculators/A&P                       Patient Vitals for the past 24 hrs:  BP Temp Temp src Pulse Resp SpO2 Height Weight  03/05/20 1922 -- -- -- -- -- -- 5' (1.524 m) 81.2 kg  03/05/20 1825 (!) 154/83 98.2 F (36.8 C) Oral 69 16 100 % 5' (1.524 m) 81.2 kg    9:55 PM Reevaluation with update and discussion. After initial assessment and treatment, an updated evaluation reveals no change in status, findings discussed and questions answered. Daleen Bo   Medical Decision Making:  This patient is presenting for evaluation of left facial weakness, which does require a range of treatment options, and is a complaint that involves a moderate risk of morbidity and mortality. The differential diagnoses include CVA, Bell's palsy, neuropathy. I decided to review old records, and in summary previously healthy adult female.  I did not require additional historical information from anyone.   Radiologic Tests Ordered, included CT head.  I independently Visualized: CT images, which show normal findings    Critical Interventions-clinical evaluation, CT imaging, observation reassessment  After These Interventions, the Patient was reevaluated and was found stable for discharge.  Clinical symptoms consistent with  Bell's palsy.  Doubt CVA, generalized neuropathy or traumatic injury.  CRITICAL CARE-no Performed by: Daleen Bo  Nursing Notes Reviewed/ Care Coordinated Applicable Imaging Reviewed Interpretation of Laboratory Data incorporated into ED treatment  The patient appears reasonably screened and/or stabilized for discharge and I doubt any other medical condition or other [facility] requiring further screening, evaluation, or treatment in the ED at this time prior to discharge.  Plan: Home Medications-OTC of choice; Home Treatments-eye care as needed; return here if the recommended treatment, does not improve the symptoms; Recommended follow up-neurology follow-up  OPC follow-up as needed     Final Clinical Impression(s) / ED Diagnoses Final diagnoses:  Bell's palsy    Rx / DC Orders ED Discharge Orders         Ordered    predniSONE (DELTASONE) 20 MG tablet  2 times daily     03/05/20 2145           Mancel Bale, MD 03/05/20 2158

## 2020-09-13 ENCOUNTER — Other Ambulatory Visit: Payer: BC Managed Care – PPO

## 2020-12-18 IMAGING — CT CT HEAD W/O CM
3 series · 16 of 46 positions shown, 19 images · non-contrast
Comparison: None.

CLINICAL DATA: Tongue numbness for several days

EXAM:
CT HEAD WITHOUT CONTRAST
TECHNIQUE: Contiguous axial images were obtained from the base of the skull
through the vertex without intravenous contrast.

[Series 2: head wo · axial · 0.39mm/px · z∈[-172,-52]mm · 10 of 29 slices shown, 13 images]
[im 3/29  brain]
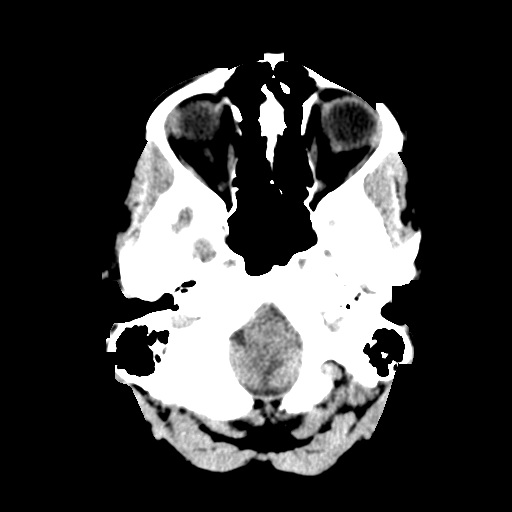
[im 3/29  bone]
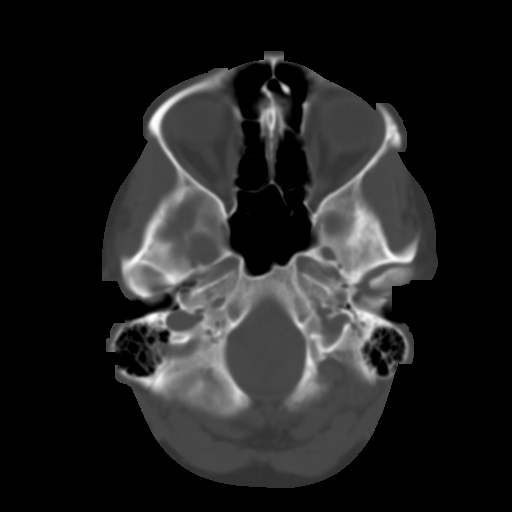
[im 6/29  brain]
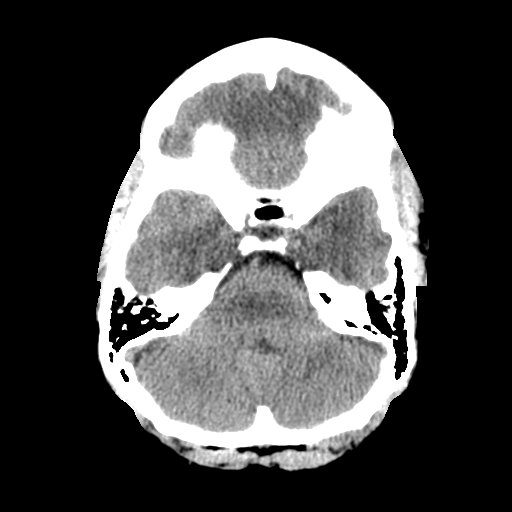
[im 8/29  brain]
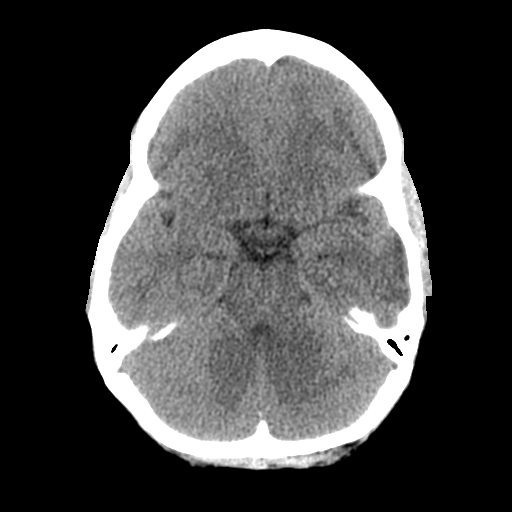
[im 11/29  brain]
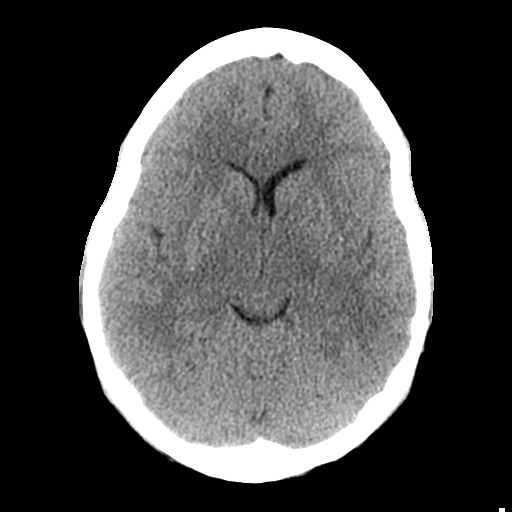
[im 14/29  brain]
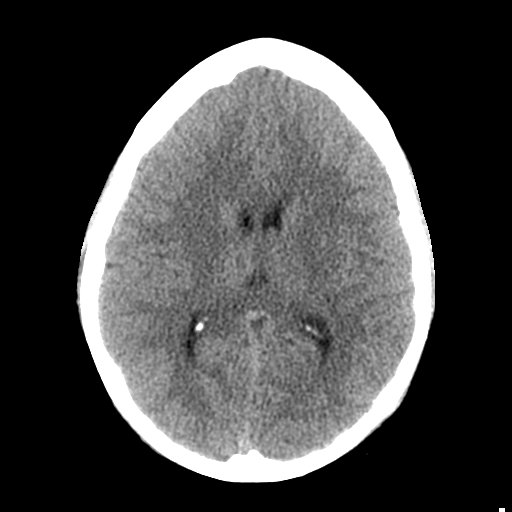
[im 14/29  bone]
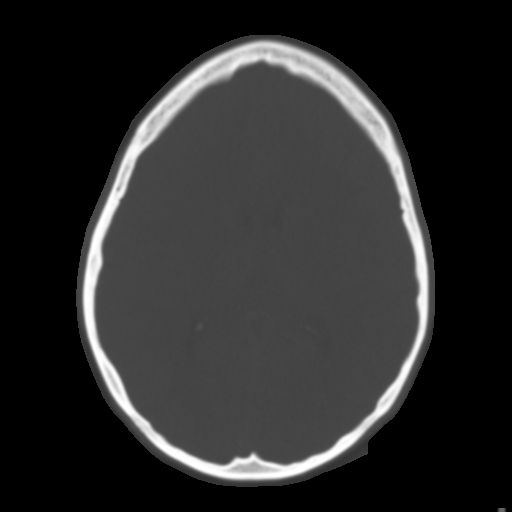
[im 16/29  brain]
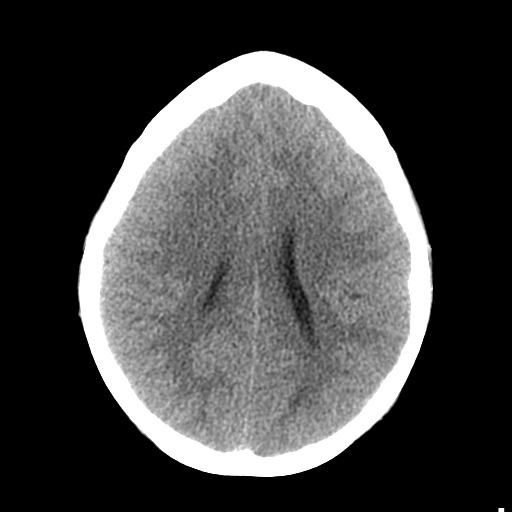
[im 19/29  brain]
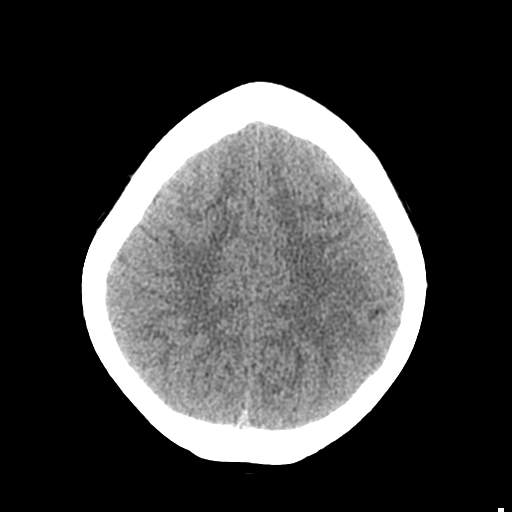
[im 22/29  brain]
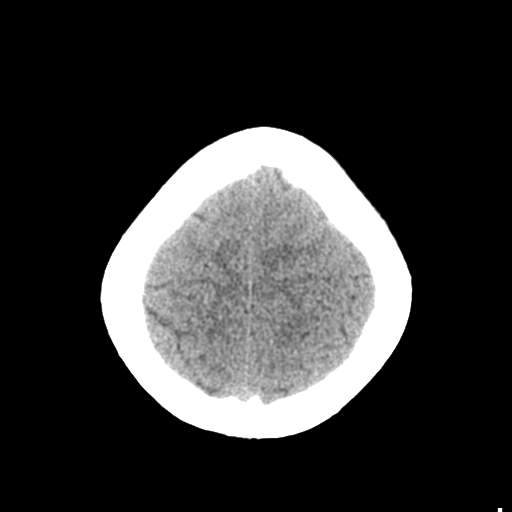
[im 24/29  brain]
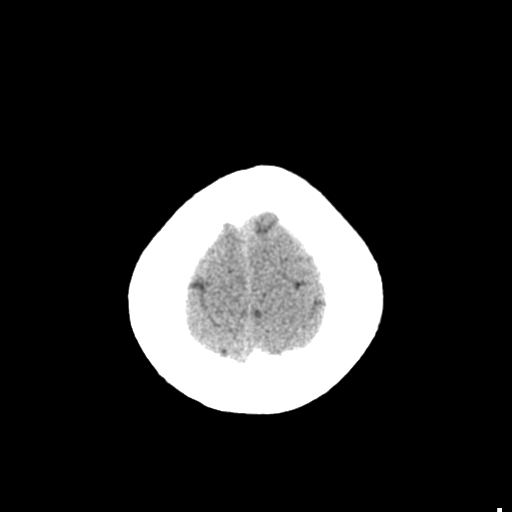
[im 24/29  bone]
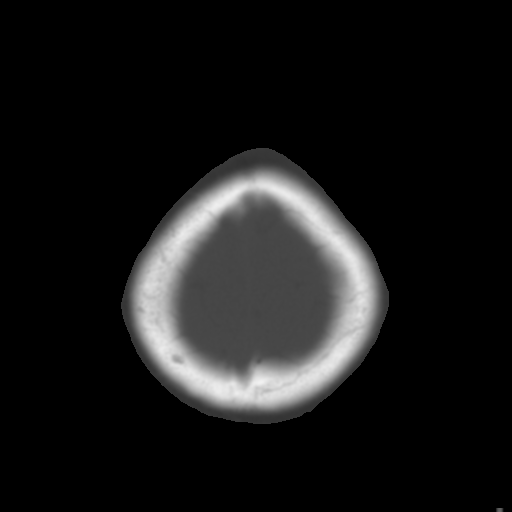
[im 27/29  brain]
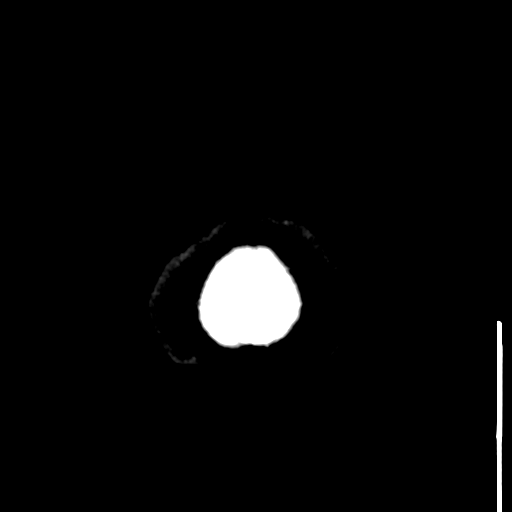

[Series 5: coronal soft tissue · coronal · 0.28mm/px · 3 of 62 slices shown]
[im 21/62  brain]
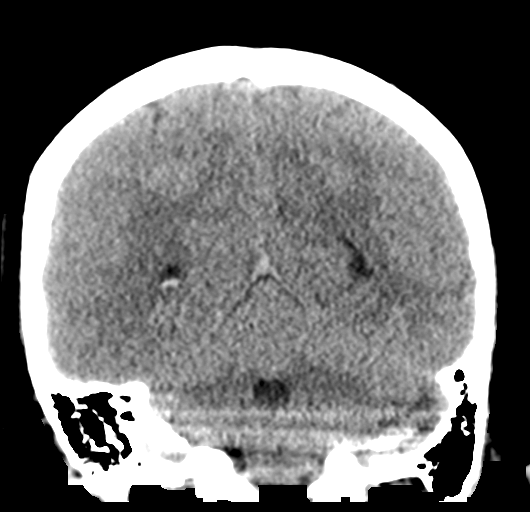
[im 28/62  brain]
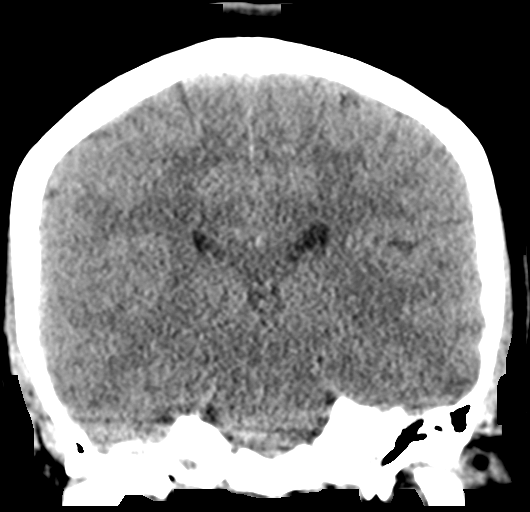
[im 34/62  brain]
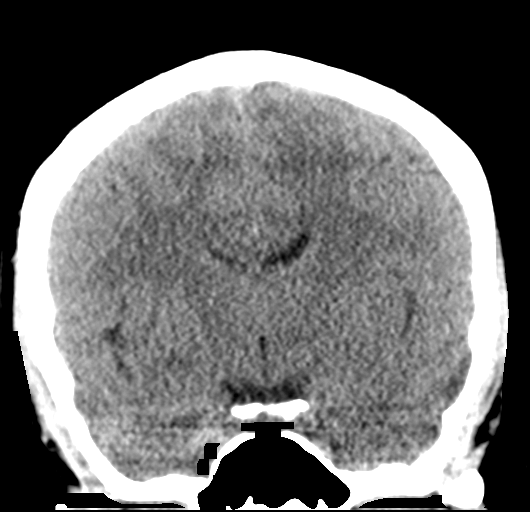

[Series 6: sagittal soft tissue · sagittal · 0.28mm/px · 3 of 50 slices shown]
[im 17/50  brain]
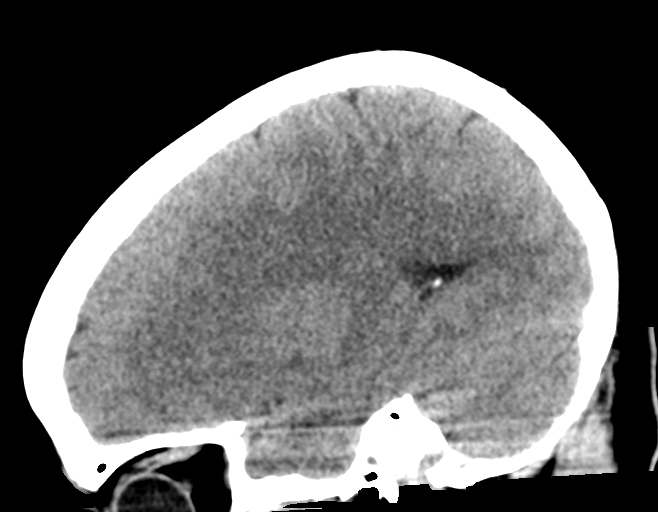
[im 25/50  brain]
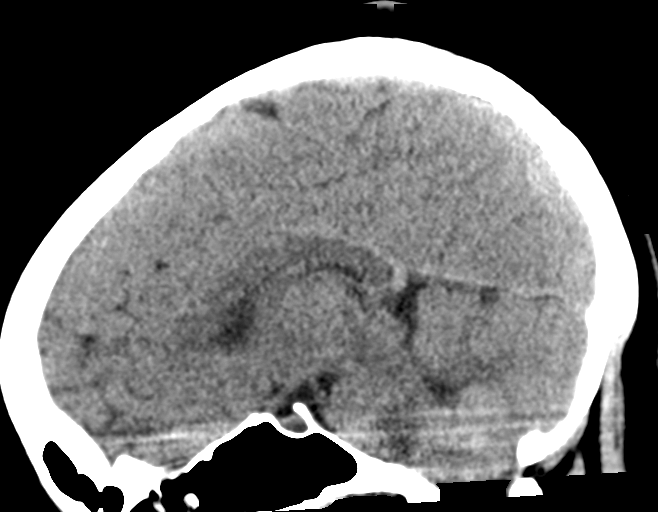
[im 33/50  brain]
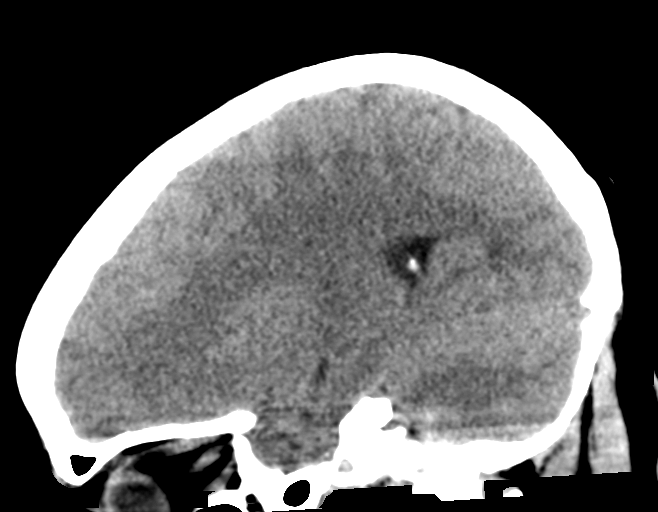

[16 of 46 positions shown; findings below may reference images not displayed]

FINDINGS: Brain: No evidence of acute infarction, hemorrhage, hydrocephalus,
extra-axial collection or mass lesion/mass effect.

Vascular: No hyperdense vessel or unexpected calcification.

Skull: Normal. Negative for fracture or focal lesion.

Sinuses/Orbits: No acute finding.

Other: None.
IMPRESSION: No acute intracranial abnormality noted.

## 2023-05-05 LAB — OB RESULTS CONSOLE HIV ANTIBODY (ROUTINE TESTING): HIV: NONREACTIVE

## 2023-05-05 LAB — OB RESULTS CONSOLE GC/CHLAMYDIA
Chlamydia: NEGATIVE
Neisseria Gonorrhea: NEGATIVE

## 2023-05-05 LAB — OB RESULTS CONSOLE RUBELLA ANTIBODY, IGM: Rubella: IMMUNE

## 2023-05-05 LAB — HEPATITIS C ANTIBODY: HCV Ab: NEGATIVE

## 2023-05-05 LAB — OB RESULTS CONSOLE HEPATITIS B SURFACE ANTIGEN: Hepatitis B Surface Ag: NEGATIVE

## 2023-06-28 ENCOUNTER — Other Ambulatory Visit: Payer: Self-pay | Admitting: Obstetrics & Gynecology

## 2023-06-29 ENCOUNTER — Observation Stay (HOSPITAL_COMMUNITY): Payer: No Typology Code available for payment source | Admitting: Anesthesiology

## 2023-06-29 ENCOUNTER — Encounter (HOSPITAL_COMMUNITY)
Admission: RE | Disposition: A | Discharge status: Home / Self Care | Payer: Self-pay | Source: Home / Self Care | Attending: Obstetrics & Gynecology

## 2023-06-29 ENCOUNTER — Observation Stay (HOSPITAL_COMMUNITY)
Admission: RE | Admit: 2023-06-29 | Discharge: 2023-06-29 | Disposition: A | Discharge status: Home / Self Care | Payer: No Typology Code available for payment source | Attending: Obstetrics & Gynecology | Admitting: Obstetrics & Gynecology

## 2023-06-29 ENCOUNTER — Encounter (HOSPITAL_COMMUNITY): Payer: Self-pay | Admitting: Obstetrics & Gynecology

## 2023-06-29 DIAGNOSIS — O3432 Maternal care for cervical incompetence, second trimester: Secondary | ICD-10-CM

## 2023-06-29 DIAGNOSIS — N883 Incompetence of cervix uteri: Secondary | ICD-10-CM | POA: Diagnosis present

## 2023-06-29 DIAGNOSIS — Z3A19 19 weeks gestation of pregnancy: Secondary | ICD-10-CM | POA: Diagnosis not present

## 2023-06-29 HISTORY — DX: Maternal care for cervical incompetence, second trimester: O34.32

## 2023-06-29 HISTORY — PX: CERVICAL CERCLAGE: SHX1329

## 2023-06-29 LAB — CBC
HCT: 38.8 % (ref 36.0–46.0)
Hemoglobin: 12.5 g/dL (ref 12.0–15.0)
MCH: 28.4 pg (ref 26.0–34.0)
MCHC: 32.2 g/dL (ref 30.0–36.0)
MCV: 88.2 fL (ref 80.0–100.0)
Platelets: 272 10*3/uL (ref 150–400)
RBC: 4.4 MIL/uL (ref 3.87–5.11)
RDW: 13.4 % (ref 11.5–15.5)
WBC: 13 10*3/uL — ABNORMAL HIGH (ref 4.0–10.5)
nRBC: 0 % (ref 0.0–0.2)

## 2023-06-29 LAB — TYPE AND SCREEN
ABO/RH(D): O POS
Antibody Screen: NEGATIVE

## 2023-06-29 SURGERY — CERCLAGE, CERVIX, VAGINAL APPROACH
Anesthesia: Monitor Anesthesia Care

## 2023-06-29 MED ORDER — CHLOROPROCAINE HCL (PF) 3 % IJ SOLN
INTRAMUSCULAR | Status: DC | PRN
  Administered 2023-06-29: 1.6 mL

## 2023-06-29 MED ORDER — PHENYLEPHRINE HCL (PRESSORS) 10 MG/ML IV SOLN
INTRAVENOUS | Status: DC | PRN
Start: 2023-06-29 — End: 2023-06-29
  Administered 2023-06-29: 80 ug via INTRAVENOUS
  Administered 2023-06-29: 160 ug via INTRAVENOUS
  Administered 2023-06-29: 80 ug via INTRAVENOUS

## 2023-06-29 MED ORDER — FENTANYL CITRATE (PF) 100 MCG/2ML IJ SOLN
INTRAMUSCULAR | Status: AC
  Filled 2023-06-29: qty 2

## 2023-06-29 MED ORDER — POVIDONE-IODINE 10 % EX SWAB
2.0000 | Freq: Once | CUTANEOUS | Status: AC
  Administered 2023-06-29: 2 via TOPICAL

## 2023-06-29 MED ORDER — ACETAMINOPHEN 500 MG PO TABS: 1000.0000 mg | ORAL_TABLET | Freq: Once | ORAL | Status: DC | PRN

## 2023-06-29 MED ORDER — CEFAZOLIN SODIUM-DEXTROSE 2-4 GM/100ML-% IV SOLN
2.0000 g | INTRAVENOUS | Status: AC
  Administered 2023-06-29: 2 g via INTRAVENOUS

## 2023-06-29 MED ORDER — CEFAZOLIN SODIUM-DEXTROSE 2-4 GM/100ML-% IV SOLN
INTRAVENOUS | Status: AC
  Filled 2023-06-29: qty 100

## 2023-06-29 MED ORDER — ACETAMINOPHEN 10 MG/ML IV SOLN: 1000.0000 mg | Freq: Once | INTRAVENOUS | Status: DC | PRN

## 2023-06-29 MED ORDER — FENTANYL CITRATE (PF) 100 MCG/2ML IJ SOLN: 25.0000 ug | INTRAMUSCULAR | Status: DC | PRN

## 2023-06-29 MED ORDER — PROGESTERONE 200 MG PO CAPS: 200.0000 mg | ORAL_CAPSULE | Freq: Every day | ORAL | 0 refills | Status: DC

## 2023-06-29 MED ORDER — ACETAMINOPHEN 160 MG/5ML PO SOLN: 1000.0000 mg | Freq: Once | ORAL | Status: DC | PRN

## 2023-06-29 MED ORDER — FENTANYL CITRATE (PF) 100 MCG/2ML IJ SOLN
INTRAMUSCULAR | Status: DC | PRN
  Administered 2023-06-29: 15 ug via INTRATHECAL

## 2023-06-29 MED ORDER — LACTATED RINGERS IV SOLN: INTRAVENOUS | Status: DC

## 2023-06-29 SURGICAL SUPPLY — 17 items
CANISTER SUCT 3000ML PPV (MISCELLANEOUS) ×1 IMPLANT
GLOVE BIO SURGEON STRL SZ7 (GLOVE) ×1 IMPLANT
GLOVE BIOGEL PI IND STRL 7.0 (GLOVE) ×1 IMPLANT
GOWN STRL REUS W/TWL LRG LVL3 (GOWN DISPOSABLE) ×2 IMPLANT
NDL MAYO CATGUT SZ4 TPR NDL (NEEDLE) IMPLANT
NEEDLE MAYO CATGUT SZ4 (NEEDLE) IMPLANT
NS IRRIG 1000ML POUR BTL (IV SOLUTION) ×1 IMPLANT
PACK VAGINAL MINOR WOMEN LF (CUSTOM PROCEDURE TRAY) ×1 IMPLANT
PAD OB MATERNITY 4.3X12.25 (PERSONAL CARE ITEMS) ×1 IMPLANT
PAD PREP 24X48 CUFFED NSTRL (MISCELLANEOUS) ×1 IMPLANT
SUT MERSILENE FIBER S 5 MO-4 1 (SUTURE) IMPLANT
SUT PROLENE 1 CT 1 30 (SUTURE) ×1 IMPLANT
SYR BULB IRRIGATION 50ML (SYRINGE) ×1 IMPLANT
TOWEL OR 17X24 6PK STRL BLUE (TOWEL DISPOSABLE) ×2 IMPLANT
TRAY FOLEY W/BAG SLVR 14FR (SET/KITS/TRAYS/PACK) ×1 IMPLANT
TUBING NON-CON 1/4 X 20 CONN (TUBING) ×1 IMPLANT
YANKAUER SUCT BULB TIP NO VENT (SUCTIONS) ×1 IMPLANT

## 2023-06-29 NOTE — Op Note (Signed)
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 Patient had no risk factors for incompetent cervix. She was counseled on options but cerclage was advised and she accepted it.  Informed written consent was obtained after reviewing risks/ complications and future risk of infection/ miscarriage/ PROM and preterm delivery.   Patient was brought to the operating room with IV running. She received preop 2 gm Ancef. She underwent Spinal anesthesia without complications. She was given dorsolithotomy position. Parts were prepped and draped in standard fashion by me and bladder was catheterized with indwelling foley. Speculum was placed and cervix was evaluated. External os appeared soft, finger tip dilated and 2.5 cm long when pulled with Rings. Bladder at cervicovaginal junction assessed. McDonald cerclage performed using Mersilene tape, starting at 1 o'clock and going in anti-clock direction taking regular purse string stitch while grasping cervix with ring forceps. Knot tied at 1 o'clock.  Bleeding from needle entry- exit sites stopped after stitch was tied down. Cerclage appeared to be closing cervix well on digital exam.  Procedure completed. Hemostasis excellent. Instruments removed and all counts correct x2.   Patient will be discharged home today. Warning signs of infection and excessive bleeding and miscarriage precautions reviewed.   I performed this surgery.  Shea Evans, MD.

## 2023-06-29 NOTE — Transfer of Care (Signed)
Immediate Anesthesia Transfer of Care Note  Patient: Marisa May  Procedure(s) Performed: Emergency [facility] CERVICAL CERCLAGE  Patient Location: PACU  Anesthesia Type:Spinal  Level of Consciousness: awake  Airway & Oxygen Therapy: Patient Spontanous Breathing  Post-op Assessment: Report given to RN  Post vital signs: Reviewed and stable  Last Vitals:  Vitals Value Taken Time  BP    Temp    Pulse    Resp 11 06/29/23 1721  SpO2    Vitals shown include unfiled device data.  Last Pain:  Vitals:   06/29/23 1508  TempSrc: Oral         Complications: No notable events documented.

## 2023-06-29 NOTE — Anesthesia Procedure Notes (Addendum)
Spinal  Patient location during procedure: OR Start time: 06/29/2023 4:19 PM End time: 06/29/2023 4:25 PM Reason for block: surgical anesthesia Staffing Performed: anesthesiologist  Anesthesiologist: Val Eagle, MD Other anesthesia staff: Ermalinda Barrios, RN Performed by: Val Eagle, MD Authorized by: Val Eagle, MD   Preanesthetic Checklist Completed: patient identified, IV checked, risks and benefits discussed, surgical consent, monitors and equipment checked, pre-op evaluation and timeout performed Spinal Block Patient position: sitting Prep: DuraPrep Patient monitoring: heart rate, cardiac monitor, continuous pulse ox and blood pressure Approach: midline Location: L4-5 Injection technique: single-shot Needle Needle type: Pencan  Needle gauge: 24 G Needle length: 9 cm Assessment Sensory level: T8 Events: CSF return

## 2023-06-29 NOTE — Discharge Instructions (Signed)
Dr. Juliene Pina discussed discharge instructions with pt

## 2023-06-29 NOTE — H&P (Addendum)
Marisa May is an 32 y.o. female  Here for 19 week incompetent cervix diagnosed at 19 wks at anatomy sono.  Pt with uncomplicated pregnancy No LEEP or uterine surgery  Past Medical History:  Diagnosis Date   Anemia    YRS AGO   Endometrial polyp     Past Surgical History:  Procedure Laterality Date   CHOLECYSTECTOMY  2021   DILATATION & CURETTAGE/HYSTEROSCOPY WITH MYOSURE N/A 06/02/2019   Procedure: DILATATION & CURETTAGE/HYSTEROSCOPY WITH MYOSURE;  Surgeon: Vick Frees, MD;  Location: [facility];  Service: Gynecology;  Laterality: N/A;   NO PAST SURGERIES      History reviewed. No pertinent family history.  Social History:  reports that she has never smoked. She has never used smokeless tobacco. She reports that she does not drink alcohol and does not use drugs.  Allergies: No Known Allergies  Medications Prior to Admission  Medication Sig Dispense Refill Last Dose   Prenatal Multivit-Min-Fe-FA (PRE-NATAL PO) Take 1 tablet by mouth daily.   06/28/2023    Review of Systems  Physical Exam BP 113/66   Pulse 84   Temp 98 F (36.7 C) (Oral)   Resp 19   Ht 5' (1.524 m)   Wt 82.6 kg   BMI 35.54 kg/m   A&O x 3, no acute distress. Pleasant HEENT neg, no thyromegaly Lungs CTA bilat CV RRR, S1S2 normal Abdo soft, non tender, non acute Extr no edema/ tenderness Pelvic  Cx closed,  1.1/2 cm long.  FHT  150s Toco none  Results for orders placed or performed during the hospital encounter of 06/29/23 (from the past 24 hour(s))  CBC     Status: Abnormal   Collection Time: 06/29/23  2:58 PM  Result Value Ref Range   WBC 13.0 (H) 4.0 - 10.5 K/uL   RBC 4.40 3.87 - 5.11 MIL/uL   Hemoglobin 12.5 12.0 - 15.0 g/dL   HCT 16.1 09.6 - 04.5 %   MCV 88.2 80.0 - 100.0 fL   MCH 28.4 26.0 - 34.0 pg   MCHC 32.2 30.0 - 36.0 g/dL   RDW 40.9 81.1 - 91.4 %   Platelets 272 150 - 400 K/uL   nRBC 0.0 0.0 - 0.2 %  Type and screen [facility]      Status: None (Preliminary result)   Collection Time: 06/29/23  3:00 PM  Result Value Ref Range   ABO/RH(D) PENDING    Antibody Screen PENDING    Sample Expiration      07/02/2023,2359 Performed at [facility], [address], [place], [place] [address]     No results found.  Assessment/Plan: 32 yo here for 19 wks emergency cerclage for incompetent cervix diagnosed at anatomy sono on 06/29/23 Risks/complications of surgery reviewed incl infection, bleeding, damage to internal organs including bladder, bowels, ureters, blood vessels, other risks from anesthesia, VTE and delayed complications of any surgery, complications in future surgery reviewed. Risk of SROM dw pt   Robley Fries 06/29/2023, 3:57 PM

## 2023-06-29 NOTE — Anesthesia Preprocedure Evaluation (Signed)
Anesthesia Evaluation  Patient identified by MRN, date of birth, ID band Patient awake    Reviewed: Allergy & Precautions, NPO status , Patient's Chart, lab work & pertinent test results  History of Anesthesia Complications Negative for: history of anesthetic complications  Airway Mallampati: III  TM Distance: >3 FB Neck ROM: Full    Dental  (+) Dental Advisory Given   Pulmonary neg pulmonary ROS   breath sounds clear to auscultation       Cardiovascular negative cardio ROS  Rhythm:Regular     Neuro/Psych negative neurological ROS  negative psych ROS   GI/Hepatic negative GI ROS, Neg liver ROS,,,  Endo/Other  negative endocrine ROS    Renal/GU negative Renal ROS     Musculoskeletal   Abdominal   Peds  Hematology negative hematology ROS (+) Lab Results      Component                Value               Date                      WBC                      13.0 (H)            06/29/2023                HGB                      12.5                06/29/2023                HCT                      38.8                06/29/2023                MCV                      88.2                06/29/2023                PLT                      272                 06/29/2023              Anesthesia Other Findings   Reproductive/Obstetrics (+) Pregnancy                             Anesthesia Physical Anesthesia Plan  ASA: 2  Anesthesia Plan: MAC and Spinal   Post-op Pain Management:    Induction:   PONV Risk Score and Plan: 2 and Ondansetron and Dexamethasone  Airway Management Planned: Natural Airway  Additional Equipment: None  Intra-op Plan:   Post-operative Plan:   Informed Consent: I have reviewed the patients History and Physical, chart, labs and discussed the procedure including the risks, benefits and alternatives for the proposed anesthesia with the patient or authorized  representative who has indicated his/her understanding and acceptance.     Dental  advisory given  Plan Discussed with: CRNA  Anesthesia Plan Comments:        Anesthesia Quick Evaluation

## 2023-06-30 ENCOUNTER — Encounter (HOSPITAL_COMMUNITY): Payer: Self-pay | Admitting: Obstetrics & Gynecology

## 2023-07-01 NOTE — Discharge Summary (Signed)
Physician Discharge Summary  Patient ID: Marisa May MRN: [id number] DOB/AGE: April 08, 1991 31 y.o.  Admit date: 06/29/2023 Discharge date: 06/29/2023  Admission Diagnoses: Cervical incompetence 19.3 wks  Discharge Diagnoses: S/p Emergency [employer] cerclage  Discharged Condition: good  Hospital Course: Uncomplicated Emergency [employer] cervical cerclage with Mersilene tape under spinal anesthesia. Post op FHT 140s   Discharge Exam: Blood pressure 114/75, pulse 84, temperature 98.3 F (36.8 C), temperature source Temporal, resp. rate (!) 22, height 5' (1.524 m), weight 82.6 kg.   Disposition: Discharge disposition: 01-Home or Self Care       Discharge Instructions     Call MD for:   Complete by: As directed    Painful contractions. Vaginal gush of fluid. Heavy vaginal bleeding that is more than spots of blood   Call MD for:  difficulty breathing, headache or visual disturbances   Complete by: As directed    Call MD for:  extreme fatigue   Complete by: As directed    Call MD for:  hives   Complete by: As directed    Call MD for:  persistant dizziness or light-headedness   Complete by: As directed    Call MD for:  persistant nausea and vomiting   Complete by: As directed    Call MD for:  severe uncontrolled pain   Complete by: As directed    Call MD for:  temperature >100.4   Complete by: As directed    Diet - low sodium heart healthy   Complete by: As directed    Driving Restrictions   Complete by: As directed    2 weeks   Increase activity slowly   Complete by: As directed    Lifting restrictions   Complete by: As directed    No more than 10 pounds entire pregnancy   Sexual Activity Restrictions   Complete by: As directed    None until cleared by doctor after delivery, no orgasm either      Allergies as of 06/29/2023   No Known Allergies      Medication List     TAKE these medications    PRE-NATAL PO Take 1 tablet by mouth daily.    progesterone 200 MG capsule Commonly known as: Prometrium Place 1 capsule (200 mg total) vaginally at bedtime.         SignedRobley Fries 07/01/2023, 7:19 AM

## 2023-07-02 ENCOUNTER — Encounter (HOSPITAL_COMMUNITY): Payer: Self-pay | Admitting: Obstetrics & Gynecology

## 2023-07-02 NOTE — Anesthesia Postprocedure Evaluation (Signed)
Anesthesia Post Note  Patient: Marisa May  Procedure(s) Performed: Emergency [facility] CERVICAL CERCLAGE     Patient location during evaluation: PACU Anesthesia Type: MAC and Spinal Level of consciousness: awake and alert Pain management: pain level controlled Vital Signs Assessment: post-procedure vital signs reviewed and stable Respiratory status: spontaneous breathing, nonlabored ventilation and respiratory function stable Cardiovascular status: stable and blood pressure returned to baseline Postop Assessment: no apparent nausea or vomiting Anesthetic complications: no   No notable events documented.  Last Vitals:  Vitals:   06/29/23 1815 06/29/23 1830  BP: 114/75   Pulse:    Resp: (!) 22   Temp:  36.8 C    Last Pain:  Vitals:   06/29/23 1830  TempSrc: Temporal  PainSc:                  Enoc Getter

## 2023-10-06 NOTE — L&D Delivery Note (Signed)
 Delivery Note At 12:32 PM a viable and healthy female was delivered via Vaginal, Spontaneous (Presentation: Middle Occiput Anterior).  APGAR: 5,8 ; weight pending .   Placenta status: Spontaneous, Intact.  Loose nuchal cord x 1 and around feet. Cord: 3 vessels with the following complications: None.  Cord pH: n/a  Anesthesia: Epidural Episiotomy: None Lacerations: 2nd degree;Perineal Suture Repair: 3.0 vicryl Est. Blood Loss (mL):  175 cc  Mom to postpartum.  Baby to Couplet care / Skin to Skin.  Marisa May A Marisa May 11/15/2023, 12:59 PM

## 2023-10-25 LAB — OB RESULTS CONSOLE GBS: GBS: POSITIVE

## 2023-11-14 ENCOUNTER — Inpatient Hospital Stay (HOSPITAL_COMMUNITY)
Admission: RE | Admit: 2023-11-14 | Discharge: 2023-11-17 | DRG: 807 | Disposition: A | Discharge status: Home / Self Care | Payer: No Typology Code available for payment source | Attending: Obstetrics and Gynecology | Admitting: Obstetrics and Gynecology

## 2023-11-14 ENCOUNTER — Inpatient Hospital Stay (HOSPITAL_COMMUNITY): Payer: No Typology Code available for payment source

## 2023-11-14 ENCOUNTER — Other Ambulatory Visit: Payer: Self-pay

## 2023-11-14 ENCOUNTER — Encounter (HOSPITAL_COMMUNITY): Payer: Self-pay | Admitting: Obstetrics and Gynecology

## 2023-11-14 DIAGNOSIS — O99824 Streptococcus B carrier state complicating childbirth: Secondary | ICD-10-CM | POA: Diagnosis present

## 2023-11-14 DIAGNOSIS — O26893 Other specified pregnancy related conditions, third trimester: Secondary | ICD-10-CM | POA: Diagnosis present

## 2023-11-14 DIAGNOSIS — O4292 Full-term premature rupture of membranes, unspecified as to length of time between rupture and onset of labor: Secondary | ICD-10-CM | POA: Diagnosis present

## 2023-11-14 DIAGNOSIS — O9902 Anemia complicating childbirth: Secondary | ICD-10-CM | POA: Diagnosis present

## 2023-11-14 DIAGNOSIS — Z3A39 39 weeks gestation of pregnancy: Secondary | ICD-10-CM

## 2023-11-14 LAB — CBC
HCT: 38.1 % (ref 36.0–46.0)
Hemoglobin: 12.6 g/dL (ref 12.0–15.0)
MCH: 29.5 pg (ref 26.0–34.0)
MCHC: 33.1 g/dL (ref 30.0–36.0)
MCV: 89.2 fL (ref 80.0–100.0)
Platelets: 236 10*3/uL (ref 150–400)
RBC: 4.27 MIL/uL (ref 3.87–5.11)
RDW: 13.4 % (ref 11.5–15.5)
WBC: 9.4 10*3/uL (ref 4.0–10.5)
nRBC: 0 % (ref 0.0–0.2)

## 2023-11-14 LAB — TYPE AND SCREEN
ABO/RH(D): O POS
Antibody Screen: NEGATIVE

## 2023-11-14 LAB — RPR: RPR Ser Ql: NONREACTIVE

## 2023-11-14 LAB — POCT FERN TEST: POCT Fern Test: POSITIVE

## 2023-11-14 LAB — HIV ANTIBODY (ROUTINE TESTING W REFLEX): HIV Screen 4th Generation wRfx: NONREACTIVE

## 2023-11-14 MED ORDER — FLEET ENEMA RE ENEM: 1.0000 | ENEMA | RECTAL | Status: DC | PRN

## 2023-11-14 MED ORDER — PHENYLEPHRINE 80 MCG/ML (10ML) SYRINGE FOR IV PUSH (FOR BLOOD PRESSURE SUPPORT)
80.0000 ug | PREFILLED_SYRINGE | INTRAVENOUS | Status: DC | PRN
  Filled 2023-11-14: qty 10

## 2023-11-14 MED ORDER — SODIUM CHLORIDE 0.9 % IV SOLN
5.0000 10*6.[IU] | Freq: Once | INTRAVENOUS | Status: AC
  Administered 2023-11-14: 5 10*6.[IU] via INTRAVENOUS
  Filled 2023-11-14: qty 5

## 2023-11-14 MED ORDER — EPHEDRINE 5 MG/ML INJ: 10.0000 mg | INTRAVENOUS | Status: DC | PRN

## 2023-11-14 MED ORDER — LACTATED RINGERS IV SOLN
500.0000 mL | Freq: Once | INTRAVENOUS | Status: AC
  Administered 2023-11-14: 500 mL via INTRAVENOUS

## 2023-11-14 MED ORDER — TERBUTALINE SULFATE 1 MG/ML IJ SOLN
0.2500 mg | Freq: Once | INTRAMUSCULAR | Status: AC | PRN
  Administered 2023-11-14: 0.25 mg via SUBCUTANEOUS
  Filled 2023-11-14: qty 1

## 2023-11-14 MED ORDER — OXYTOCIN-SODIUM CHLORIDE 30-0.9 UT/500ML-% IV SOLN
1.0000 m[IU]/min | INTRAVENOUS | Status: DC
  Administered 2023-11-14 (×2): 2 m[IU]/min via INTRAVENOUS
  Filled 2023-11-14: qty 500

## 2023-11-14 MED ORDER — LIDOCAINE HCL (PF) 1 % IJ SOLN
INTRAMUSCULAR | Status: DC | PRN
  Administered 2023-11-14 (×2): 5 mL via EPIDURAL

## 2023-11-14 MED ORDER — SOD CITRATE-CITRIC ACID 500-334 MG/5ML PO SOLN: 30.0000 mL | ORAL | Status: DC | PRN

## 2023-11-14 MED ORDER — EPHEDRINE 5 MG/ML INJ
10.0000 mg | INTRAVENOUS | Status: DC | PRN
  Filled 2023-11-14: qty 5

## 2023-11-14 MED ORDER — PENICILLIN G POT IN DEXTROSE 60000 UNIT/ML IV SOLN
3.0000 10*6.[IU] | INTRAVENOUS | Status: DC
  Administered 2023-11-14 – 2023-11-15 (×6): 3 10*6.[IU] via INTRAVENOUS
  Filled 2023-11-14 (×12): qty 50

## 2023-11-14 MED ORDER — MISOPROSTOL 50MCG HALF TABLET
50.0000 ug | ORAL_TABLET | Freq: Once | ORAL | Status: AC
  Administered 2023-11-14: 50 ug via BUCCAL

## 2023-11-14 MED ORDER — SODIUM CHLORIDE 0.9% FLUSH: 3.0000 mL | Freq: Two times a day (BID) | INTRAVENOUS | Status: DC

## 2023-11-14 MED ORDER — OXYTOCIN-SODIUM CHLORIDE 30-0.9 UT/500ML-% IV SOLN
2.5000 [IU]/h | INTRAVENOUS | Status: DC
  Administered 2023-11-15: 2.5 [IU]/h via INTRAVENOUS
  Filled 2023-11-14 (×2): qty 500

## 2023-11-14 MED ORDER — PHENYLEPHRINE 80 MCG/ML (10ML) SYRINGE FOR IV PUSH (FOR BLOOD PRESSURE SUPPORT)
80.0000 ug | PREFILLED_SYRINGE | INTRAVENOUS | Status: DC | PRN
  Administered 2023-11-14: 160 ug via INTRAVENOUS

## 2023-11-14 MED ORDER — LACTATED RINGERS IV SOLN
INTRAVENOUS | Status: DC
Start: 2023-11-14 — End: 2023-11-15

## 2023-11-14 MED ORDER — ACETAMINOPHEN 325 MG PO TABS: 650.0000 mg | ORAL_TABLET | ORAL | Status: DC | PRN

## 2023-11-14 MED ORDER — ONDANSETRON HCL 4 MG/2ML IJ SOLN: 4.0000 mg | Freq: Four times a day (QID) | INTRAMUSCULAR | Status: DC | PRN

## 2023-11-14 MED ORDER — SODIUM CHLORIDE 0.9% FLUSH: 3.0000 mL | INTRAVENOUS | Status: DC | PRN

## 2023-11-14 MED ORDER — OXYCODONE-ACETAMINOPHEN 5-325 MG PO TABS: 2.0000 | ORAL_TABLET | ORAL | Status: DC | PRN

## 2023-11-14 MED ORDER — LIDOCAINE HCL (PF) 1 % IJ SOLN: 30.0000 mL | INTRAMUSCULAR | Status: DC | PRN

## 2023-11-14 MED ORDER — DIPHENHYDRAMINE HCL 50 MG/ML IJ SOLN
12.5000 mg | INTRAMUSCULAR | Status: DC | PRN
  Administered 2023-11-15: 12.5 mg via INTRAVENOUS
  Filled 2023-11-14: qty 1

## 2023-11-14 MED ORDER — SODIUM CHLORIDE 0.9 % IV SOLN: 250.0000 mL | INTRAVENOUS | Status: DC | PRN

## 2023-11-14 MED ORDER — LACTATED RINGERS IV SOLN
500.0000 mL | INTRAVENOUS | Status: DC | PRN
  Administered 2023-11-14 – 2023-11-15 (×2): 500 mL via INTRAVENOUS

## 2023-11-14 MED ORDER — FENTANYL-BUPIVACAINE-NACL 0.5-0.125-0.9 MG/250ML-% EP SOLN
12.0000 mL/h | EPIDURAL | Status: DC | PRN
  Administered 2023-11-14 – 2023-11-15 (×2): 12 mL/h via EPIDURAL
  Filled 2023-11-14 (×2): qty 250

## 2023-11-14 MED ORDER — OXYCODONE-ACETAMINOPHEN 5-325 MG PO TABS: 1.0000 | ORAL_TABLET | ORAL | Status: DC | PRN

## 2023-11-14 MED ORDER — OXYTOCIN BOLUS FROM INFUSION
333.0000 mL | Freq: Once | INTRAVENOUS | Status: AC
  Administered 2023-11-15: 333 mL via INTRAVENOUS

## 2023-11-14 NOTE — Progress Notes (Signed)
 Labor Progress Note  S/O: Pt resting comfortably with epidural. Husband and patient's father supportive at bedside. They are excited for a baby boy Ozell   Vitals:   11/14/23 1910 11/14/23 1930  BP: 119/70 116/64  Pulse: 81 70  Resp:    Temp:    SpO2: 99% 100%    SVE: 6/80/-1 per RN exam at 1830  EFM: Cat II baseline 140 bpm mod var +accels with intermittent variable/late decels  Toco: Ctxs q 2-3 min  A/P: 32Y G1P0 @ 39.1 IOL for PROM  -PROM since 0326 this morning, light meconium, s/p 1 dose of Cytotec  and now s/p Pitocin : had been up to 6mU but turned off around 1715 for tachysystole with prolonged decel. Pt also received 1 dose of Terbutaline  at that time. Remains contracting on own with a good pattern. Consider IUPC placement for restarting Pitocin  if no cervical change with next check. Afebrile, cont to monitor -Cont EFM/Toco: Cat II for intermittent variable/late decel but overall reassuring with mod var and presence of accels, pt and RN note fetus very active. FSE had been attempted by day shift Rns x2 but did not stick, suspected a lot of fetal hair on scalp interfering with FSE.  -Epidural labor pain mgmt -Cont PCN for +GBS -Routine intrapartum care -Anticipate SVD  Ruvi Fullenwider A Mikenna Bunkley 11/14/23 8:09 PM

## 2023-11-14 NOTE — MAU Note (Signed)
.  Marisa May is a 33 y.o. at [redacted]w[redacted]d here in MAU reporting: SROM at 0326-pt denies vaginal bleeding or bloody show; denies regular or painful cramping or contractions. Endorses + fetal movement. GBS +  Onset of complaint: 0326 Pain score: None Vitals:   11/14/23 0527  BP: 132/83  Pulse: 80  Resp: 17  Temp: 97.7 F (36.5 C)  SpO2: 100%     FHT: 135  Lab orders placed from triage: mau labor

## 2023-11-14 NOTE — MAU Note (Signed)

## 2023-11-14 NOTE — Anesthesia Preprocedure Evaluation (Signed)
 Anesthesia Evaluation  Patient identified by MRN, date of birth, ID band Patient awake    Reviewed: Allergy & Precautions, H&P , NPO status , Patient's Chart, lab work & pertinent test results  Airway Mallampati: II  TM Distance: >3 FB Neck ROM: Full    Dental no notable dental hx.    Pulmonary neg pulmonary ROS   Pulmonary exam normal breath sounds clear to auscultation       Cardiovascular negative cardio ROS Normal cardiovascular exam Rhythm:Regular Rate:Normal     Neuro/Psych negative neurological ROS  negative psych ROS   GI/Hepatic negative GI ROS, Neg liver ROS,,,  Endo/Other  negative endocrine ROS    Renal/GU negative Renal ROS  negative genitourinary   Musculoskeletal negative musculoskeletal ROS (+)    Abdominal   Peds negative pediatric ROS (+)  Hematology  (+) Blood dyscrasia, anemia   Anesthesia Other Findings Cervical incompetenc s/p cerclage  Reproductive/Obstetrics (+) Pregnancy                             Anesthesia Physical Anesthesia Plan  ASA: 2  Anesthesia Plan: Epidural   Post-op Pain Management:    Induction:   PONV Risk Score and Plan: 2 and Treatment may vary due to age or medical condition  Airway Management Planned: Natural Airway  Additional Equipment:   Intra-op Plan:   Post-operative Plan:   Informed Consent: I have reviewed the patients History and Physical, chart, labs and discussed the procedure including the risks, benefits and alternatives for the proposed anesthesia with the patient or authorized representative who has indicated his/her understanding and acceptance.       Plan Discussed with: Anesthesiologist  Anesthesia Plan Comments: (Patient identified. Risks, benefits, options discussed with patient including but not limited to bleeding, infection, nerve damage, paralysis, failed block, incomplete pain control, headache, blood  pressure changes, nausea, vomiting, reactions to medication, itching, and post partum back pain. Confirmed with bedside nurse the patient's most recent platelet count. Confirmed with the patient that they are not taking any anticoagulation, have any bleeding history or any family history of bleeding disorders. Patient expressed understanding and wishes to proceed. All questions were answered. )       Anesthesia Quick Evaluation

## 2023-11-14 NOTE — H&P (Signed)
 Marisa May is a 33 y.o. G1P0 at [redacted]w[redacted]d gestation presents for complaint of Loss of fluid. No contractions, bleeding. +FM; Meconium fluid noted in MAU.  Antepartum course:  short cx, s/p cervical cerclage removed at 32 wga, BMI >30   [facility] at [facility] 10 wks.  See complete pre-natal records  History OB History     Gravida  1   Para      Term      Preterm      AB      Living         SAB      IAB      Ectopic      Multiple      Live Births             Past Medical History:  Diagnosis Date   Anemia    YRS AGO   Endometrial polyp    Incompetent cervix during second trimester, antepartum 06/29/2023   Past Surgical History:  Procedure Laterality Date   CERVICAL CERCLAGE N/A 06/29/2023   Procedure: Emergency McDonald CERVICAL CERCLAGE;  Surgeon: Barbette Knock, MD;  Location: [facility];  Service: Gynecology;  Laterality: N/A;  Requests 45 min.   CHOLECYSTECTOMY  2021   DILATATION & CURETTAGE/HYSTEROSCOPY WITH MYOSURE N/A 06/02/2019   Procedure: DILATATION & CURETTAGE/HYSTEROSCOPY WITH MYOSURE;  Surgeon: Linnell Devere BRAVO, MD;  Location: [facility];  Service: Gynecology;  Laterality: N/A;   Family History: family history is not on file. Social History:  reports that she has never smoked. She has never used smokeless tobacco. She reports that she does not drink alcohol and does not use drugs.  ROS: See above otherwise negative  Prenatal labs:  ABO, Rh: --/--/O POS (02/09 0547) Antibody: NEG (02/09 0547) Rubella: Immune (07/31 0000) RPR:   non-reactive HBsAg: Negative (07/31 0000)  HIV:Non Reactive (02/09 0547)  GBS: Positive/-- (01/20 0000)  1 hr Glucola:  failed 1 hr gtt, passed 3 hr gtt Genetic screening: Normal Anatomy US : Normal  Physical Exam:   Dilation: 4 Effacement (%): 50 Station: -2 Exam by:: Corean Berber RN Blood pressure 112/77, pulse 75, temperature 97.9 F (36.6 C), temperature source Oral,  resp. rate 16, height 5' (1.524 m), weight 90.6 kg, SpO2 100%. A&O x 3 HEENT: Normal Lungs: CTAB CV: RRR Abdominal: Soft, Non-tender, Gravid, and Estimated fetal weight: 7 - 7 1/2 lbs  Lower Extremities: Non-edematous, Non-tender  Pelvic Exam: 4/50/-2, cephalic      Labs:  CBC:  Lab Results  Component Value Date   WBC 9.4 11/14/2023   RBC 4.27 11/14/2023   HGB 12.6 11/14/2023   HCT 38.1 11/14/2023   MCV 89.2 11/14/2023   MCH 29.5 11/14/2023   MCHC 33.1 11/14/2023   RDW 13.4 11/14/2023   PLT 236 11/14/2023   CMP:  Lab Results  Component Value Date   NA 138 09/22/2019   K 3.9 09/22/2019   CL 104 09/22/2019   CO2 25 09/22/2019   GLUCOSE 109 (H) 09/22/2019   BUN 13 09/22/2019   CREATININE 0.73 09/22/2019   CALCIUM 9.5 09/22/2019   PROT 7.5 09/22/2019   AST 28 09/22/2019   ALT 22 09/22/2019   ALBUMIN 4.2 09/22/2019   ALKPHOS 79 09/22/2019   BILITOT 0.2 (L) 09/22/2019   GFRNONAA >60 09/22/2019   GFRAA >60 09/22/2019   ANIONGAP 9 09/22/2019   Urine: Lab Results  Component Value Date   COLORURINE YELLOW 09/22/2019   APPEARANCEUR HAZY (A) 09/22/2019  LABSPEC >1.030 (H) 09/22/2019   PHURINE 6.0 09/22/2019   GLUCOSEU NEGATIVE 09/22/2019   HGBUR TRACE (A) 09/22/2019   BILIRUBINUR NEGATIVE 09/22/2019   KETONESUR NEGATIVE 09/22/2019   PROTEINUR NEGATIVE 09/22/2019   NITRITE NEGATIVE 09/22/2019   LEUKOCYTESUR NEGATIVE 09/22/2019     Prenatal Transfer Tool  Maternal Diabetes: No Genetic Screening: Normal Maternal Ultrasounds/Referrals: Normal Fetal Ultrasounds or other Referrals:  None Maternal Substance Abuse:  No Significant Maternal Medications:  None Significant Maternal Lab Results: Group B Strep positive Number of Prenatal Visits:greater than 3 verified prenatal visits Maternal Vaccinations:TDap Other Comments:  None  FHT: 130s, nml variability, +accels, one decel to 90s about 4 hrs ago/1-2 min and back to baseline, none since TOCO: irregular,  mild  Assessment/Plan:  33 y.o. G1P0 at [redacted]w[redacted]d gestation   SROM - meconium fluid, admit for delivery; s/p cytotec  50mcg buccally, no 4, will augment with pitocin ; plan svd Fetal status reassuring Gbs pos - pcn for prophylaxis Rh pos RI H/o cervical incompetence, s/p cervical cerclage and removal at 36 wga Bmi >30 AGA growth: cephalic, afi 11.6cm, fht 144, efw 41% (7'5) 2 days ago   Devere FORBES Brave 11/14/2023, 1:09 PM

## 2023-11-14 NOTE — Anesthesia Procedure Notes (Signed)
 Epidural Patient location during procedure: OB Start time: 11/14/2023 4:10 PM End time: 11/14/2023 4:17 PM  Staffing Anesthesiologist: Erma Thom SAUNDERS, MD Performed: anesthesiologist   Preanesthetic Checklist Completed: patient identified, IV checked, risks and benefits discussed, monitors and equipment checked, pre-op evaluation and timeout performed  Epidural Patient position: sitting Prep: DuraPrep Patient monitoring: heart rate, cardiac monitor, continuous pulse ox and blood pressure Approach: midline Location: L3-L4 Injection technique: LOR air  Needle:  Needle type: Tuohy  Needle gauge: 17 G Needle length: 9 cm Needle insertion depth: 7 cm Catheter type: closed end flexible Catheter size: 19 Gauge Catheter at skin depth: 12 cm Test dose: negative  Assessment Sensory level: T8  Additional Notes Patient identified. Risks/Benefits/Options discussed with patient including but not limited to bleeding, infection, nerve damage, paralysis, failed block, incomplete pain control, headache, blood pressure changes, nausea, vomiting, reactions to medication both or allergic, itching and postpartum back pain. Confirmed with bedside nurse the patient's most recent platelet count. Confirmed with patient that they are not currently taking any anticoagulation, have any bleeding history or any family history of bleeding disorders. Patient expressed understanding and wished to proceed. All questions were answered. Sterile technique was used throughout the entire procedure. Please see nursing notes for vital signs. Test dose was given through epidural catheter and negative prior to continuing to dose epidural or start infusion. Warning signs of high block given to the patient including shortness of breath, tingling/numbness in hands, complete motor block, or any concerning symptoms with instructions to call for help. Patient was given instructions on fall risk and not to get out of bed. All questions and  concerns addressed with instructions to call with any issues or inadequate analgesia.  Reason for block:procedure for pain

## 2023-11-15 ENCOUNTER — Encounter (HOSPITAL_COMMUNITY): Payer: Self-pay | Admitting: Obstetrics and Gynecology

## 2023-11-15 MED ORDER — COCONUT OIL OIL
1.0000 | TOPICAL_OIL | Status: DC | PRN
  Administered 2023-11-15 – 2023-11-16 (×2): 1 via TOPICAL

## 2023-11-15 MED ORDER — TETANUS-DIPHTH-ACELL PERTUSSIS 5-2.5-18.5 LF-MCG/0.5 IM SUSY: 0.5000 mL | PREFILLED_SYRINGE | Freq: Once | INTRAMUSCULAR | Status: DC

## 2023-11-15 MED ORDER — DIPHENHYDRAMINE HCL 25 MG PO CAPS: 25.0000 mg | ORAL_CAPSULE | Freq: Four times a day (QID) | ORAL | Status: DC | PRN

## 2023-11-15 MED ORDER — ONDANSETRON HCL 4 MG PO TABS: 4.0000 mg | ORAL_TABLET | ORAL | Status: DC | PRN

## 2023-11-15 MED ORDER — ONDANSETRON HCL 4 MG/2ML IJ SOLN: 4.0000 mg | INTRAMUSCULAR | Status: DC | PRN

## 2023-11-15 MED ORDER — BUPIVACAINE HCL (PF) 0.25 % IJ SOLN
INTRAMUSCULAR | Status: DC | PRN
  Administered 2023-11-15: 8 mL via EPIDURAL
  Administered 2023-11-15: 10 mL via EPIDURAL

## 2023-11-15 MED ORDER — PRENATAL MULTIVITAMIN CH
1.0000 | ORAL_TABLET | Freq: Every day | ORAL | Status: DC
Start: 2023-11-16 — End: 2023-11-17
  Administered 2023-11-16 – 2023-11-17 (×2): 1 via ORAL
  Filled 2023-11-15 (×2): qty 1

## 2023-11-15 MED ORDER — FENTANYL CITRATE (PF) 100 MCG/2ML IJ SOLN
INTRAMUSCULAR | Status: AC
  Filled 2023-11-15: qty 2

## 2023-11-15 MED ORDER — ACETAMINOPHEN 325 MG PO TABS: 650.0000 mg | ORAL_TABLET | ORAL | Status: DC | PRN

## 2023-11-15 MED ORDER — SENNOSIDES-DOCUSATE SODIUM 8.6-50 MG PO TABS
2.0000 | ORAL_TABLET | Freq: Every day | ORAL | Status: DC
  Administered 2023-11-16 – 2023-11-17 (×2): 2 via ORAL
  Filled 2023-11-15 (×2): qty 2

## 2023-11-15 MED ORDER — BENZOCAINE-MENTHOL 20-0.5 % EX AERO
1.0000 | INHALATION_SPRAY | CUTANEOUS | Status: DC | PRN
  Administered 2023-11-16: 1 via TOPICAL
  Filled 2023-11-15: qty 56

## 2023-11-15 MED ORDER — DIBUCAINE (PERIANAL) 1 % EX OINT: 1.0000 | TOPICAL_OINTMENT | CUTANEOUS | Status: DC | PRN

## 2023-11-15 MED ORDER — SIMETHICONE 80 MG PO CHEW: 80.0000 mg | CHEWABLE_TABLET | ORAL | Status: DC | PRN

## 2023-11-15 MED ORDER — ZOLPIDEM TARTRATE 5 MG PO TABS: 5.0000 mg | ORAL_TABLET | Freq: Every evening | ORAL | Status: DC | PRN

## 2023-11-15 MED ORDER — FENTANYL CITRATE (PF) 100 MCG/2ML IJ SOLN
INTRAMUSCULAR | Status: DC | PRN
  Administered 2023-11-15: 100 ug via EPIDURAL

## 2023-11-15 MED ORDER — IBUPROFEN 600 MG PO TABS
600.0000 mg | ORAL_TABLET | Freq: Four times a day (QID) | ORAL | Status: DC
  Administered 2023-11-15 – 2023-11-17 (×7): 600 mg via ORAL
  Filled 2023-11-15 (×7): qty 1

## 2023-11-15 MED ORDER — WITCH HAZEL-GLYCERIN EX PADS: 1.0000 | MEDICATED_PAD | CUTANEOUS | Status: DC | PRN

## 2023-11-15 NOTE — Progress Notes (Signed)
 Labor Progress Note  S/O: Pt resting comfortably with epidural.   Vitals:   11/15/23 0729 11/15/23 0731  BP:  134/77  Pulse:  68  Resp:    Temp: 98.6 F (37 C)   SpO2:      SVE: 8/90/-1  EFM: Cat II baseline 140 bpm mod var +accels with intermittent variable/late decels  Toco: Ctxs q 2-3 min  A/P: 32Y G1P0 @ 39.2 labor  -Labor: s/p 1 dose of cytotec  for PROM 2/9 0330 > now on Pitocin  of 6mU. Has been 8 cm dilation since 0030, per RN exam over night had progressed to anterior lip however day shift RN exam and my own exam confirm 8 cm with some swelling anteriorly noted and high fetal station at -1. Pt consented for IUPC placement to evaluate ctx adequacy, inc Pitocin  as needed. Pt and husband advised if no progress with adequate ctxs will recommend proceeding with cesarean section -Cont EFM/Toco: Cat II for intermittent variable/late decel but overall reassuring with mod var and presence of accels, FSE placed for better tracing due to high fetal activity  -Epidural labor pain mgmt -Cont PCN for +GBS -Routine intrapartum care -MOD: working towards SVD but guarded  Marisa May 11/15/23 7:55 AM

## 2023-11-15 NOTE — Progress Notes (Signed)
 Labor Progress Note  S/O: Pt reports intermittent pressure  Vitals:   11/15/23 0932 11/15/23 1001  BP: (!) 145/81 127/76  Pulse: 60 (!) 59  Resp:    Temp:    SpO2:      SVE: 10/100/+1  FSE: Cat I baseline 135 bpm mod var +accels, intermittent early decels IUPC: ctxs q 1-3 min MVU 180-200 avg  A/P: 32Y G1P0 @ 39.2 labor  -Labor: s/p 1 dose of cytotec  for PROM 2/9 0330 > now on Pitocin  of 16mU and now complete. Will allow time for increased maternal pressure and passive fetal descent before starting pushing -Cont FSE/IUPC monitoring, Cat I currently -Epidural labor pain mgmt -Cont PCN for +GBS -Routine intrapartum care -MOD: working towards SVD  Ronette Hank A Franky Reier 11/15/23 10:55 AM

## 2023-11-15 NOTE — Lactation Note (Signed)
 This note was copied from a baby's chart.  NICU Lactation Consultation Note  Patient Name: Marisa May Date: 11/15/2023 Age:32 hours  Reason for consult: Initial assessment; Primapara; 1st time breastfeeding; NICU baby; Breastfeeding assistance; RN request; Term  SUBJECTIVE Visited with family of 32 83/34 weeks old NICU female; baby "Marisa May" got admitted to the NICU due to meconium stain fluid monitoring. Marisa May is a P1 and reported she's primarily planning on breastfeeding if possible. This LC assisted with the latch and took baby to the L side in football first, then in cross cradle hold using a NS # 20 and he was able to latch with ease (once NS # was used) but required continuous stimulation for sucking. He fed for 5 minutes, still spitty (see LATCH score). Assisted with hand expression, breast massage and set up DEBP and got Marisa May pumping during [facility] consult, provided a pumping band in size "L" as well for hands on pumping. Reviewed normal newborn behavior, size of baby's stomach, feeding cues, pumping schedule, pump settings and anticipatory guidelines.  OBJECTIVE Infant data: Mother's Current Feeding Choice: Breast Milk  O2 Device: HHFNC O2 Flow Rate (L/min): 2 L/min FiO2 (%): 21 %  Infant feeding assessment IDFTS - Readiness: 2   Maternal data: G1P1001 Vaginal, Spontaneous Has patient been taught Hand Expression?: Yes Hand Expression Comments: small droplets of colostrum noted Significant Breast History:: (+) breast changes during the pregnancy Current breast feeding challenges:: NICU admission Does the patient have breastfeeding experience prior to this delivery?: No Pumping frequency: initated pumping at 3 hours post-partum Pumped volume: 0 mL (droplets) Flange Size: 21 Hands-free pumping top sizes: Large [name]) Risk factor for low/delayed milk supply:: primipara, infant separation  Pump: Personal, Hands Free (Mom Cozy through  insurance)  ASSESSMENT Infant: Latch: Repeated attempts needed to sustain latch, nipple held in mouth throughout feeding, stimulation needed to elicit sucking reflex. Audible Swallowing: A few with stimulation (but colostrum noted on NS # 20) Type of Nipple: Flat Comfort (Breast/Nipple): Soft / non-tender Hold (Positioning): Assistance needed to correctly position infant at breast and maintain latch. LATCH Score: 6  Feeding Status: Ad lib Feeding method: Breast  Maternal: Milk volume: Normal  INTERVENTIONS/PLAN Interventions: Interventions: Breast feeding basics reviewed; Assisted with latch; Skin to skin; Breast massage; Hand express; Breast compression; Adjust position; Support pillows; Coconut oil; DEBP; Education; [employer] brochure; NICU Pumping Log; CDC Guidelines for Breast Pump Cleaning Tools: Pump; Flanges; Coconut oil; Hands-free pumping top; Nipple Marisa May Pump Education: Setup, frequency, and cleaning; Milk Storage Nipple shield size: 20  Plan: Encouraged to put baby to breast +8 times/24 hours or sooner if feeding cues are present using NS # 20 PRN Pumping every 3 hours, ideally 8 pumping sessions/24 hours was also encouraged STS whenever possible  FOB and PGF present. Family is bilingual, this consult took place in both languages Spanish and [place]. All questions and concerns answered, family to contact [facility] services PRN.  Consult Status: NICU follow-up NICU Follow-up type: New admission follow up   Marisa May Bare 11/15/2023, 4:33 PM

## 2023-11-16 LAB — CBC
HCT: 27.4 % — ABNORMAL LOW (ref 36.0–46.0)
Hemoglobin: 9.1 g/dL — ABNORMAL LOW (ref 12.0–15.0)
MCH: 29.8 pg (ref 26.0–34.0)
MCHC: 33.2 g/dL (ref 30.0–36.0)
MCV: 89.8 fL (ref 80.0–100.0)
Platelets: 170 10*3/uL (ref 150–400)
RBC: 3.05 MIL/uL — ABNORMAL LOW (ref 3.87–5.11)
RDW: 13.7 % (ref 11.5–15.5)
WBC: 12.1 10*3/uL — ABNORMAL HIGH (ref 4.0–10.5)
nRBC: 0 % (ref 0.0–0.2)

## 2023-11-16 NOTE — Anesthesia Postprocedure Evaluation (Signed)
Anesthesia Post Note  Patient: Marisa May  Procedure(s) Performed: AN AD HOC LABOR EPIDURAL     Patient location during evaluation: NICU Anesthesia Type: Epidural Level of consciousness: awake, oriented and awake and alert Pain management: pain level controlled Vital Signs Assessment: post-procedure vital signs reviewed and stable Respiratory status: spontaneous breathing, respiratory function stable and nonlabored ventilation Cardiovascular status: stable Postop Assessment: no headache, adequate PO intake, able to ambulate, patient able to bend at knees and no apparent nausea or vomiting Anesthetic complications: no   No notable events documented.  Last Vitals:  Vitals:   11/16/23 0043 11/16/23 0442  BP: 112/76 108/63  Pulse: 70 80  Resp: 20 16  Temp: 36.5 C 36.6 C  SpO2: 100% 100%    Last Pain:  Vitals:   11/16/23 0808  TempSrc:   PainSc: 3    Pain Goal: Patients Stated Pain Goal: 0 (11/15/23 0831)              Epidural/Spinal Function Cutaneous sensation: Normal sensation (11/16/23 1610), Patient able to flex knees: Yes (11/16/23 9604), Patient able to lift hips off bed: Yes (11/16/23 0808), Back pain beyond tenderness at insertion site: No (11/16/23 0808), Progressively worsening motor and/or sensory loss: No (11/16/23 5409), Bowel and/or bladder incontinence post epidural: No (11/16/23 0808)  [name]

## 2023-11-16 NOTE — Lactation Note (Signed)
This note was copied from a baby's chart. Lactation Consultation Note  Patient Name: Marisa May [id number] Date: 11/16/2023 Age:32 hours Reason for consult: Follow-up assessment;Primapara;1st time breastfeeding;Term;Nipple pain/trauma;Breastfeeding assistance  P1- MOB reports that infant is nursing more frequently and it is painful. MOB also reports that she is post-feeding pumping and has been consistently collecting 5 mL of EBM. LC praised MOB for the EBM volume. MOB was about to latch infant, so LC offered to assist. MOB stimulated her nipple to make it evert more for a few minutes. Infant was placed on the left breast in the cross cradle hold. MOB compressed her breast to create a shelf, then latched infant. MOB complained of pain. LC taught MOB how to flange infant's lips. MOB stated that this made the feeding feel much better. LC encouraged MOB to continue flanging infant's lips with each feeding if they are tucked. MOB denied further questions or concerns. LC encouraged MOB to call for further assistance as needed.  Maternal Data Has patient been taught Hand Expression?: Yes Does the patient have breastfeeding experience prior to this delivery?: No  Feeding Mother's Current Feeding Choice: Breast Milk and Donor Milk  LATCH Score Latch: Grasps breast easily, tongue down, lips flanged, rhythmical sucking.  Audible Swallowing: Spontaneous and intermittent  Type of Nipple: Everted at rest and after stimulation  Comfort (Breast/Nipple): Filling, red/small blisters or bruises, mild/mod discomfort  Hold (Positioning): Assistance needed to correctly position infant at breast and maintain latch.  LATCH Score: 8   Lactation Tools Discussed/Used Tools: Pump;Flanges Flange Size: 18 Breast pump type: Double-Electric Breast Pump;Manual Pump Education: Setup, frequency, and cleaning;Milk Storage Reason for Pumping: MOB request Pumping frequency: 15-20 min every 3 hrs as  needed Pumped volume: 5 mL  Interventions Interventions: Breast feeding basics reviewed;Assisted with latch;Breast compression;Adjust position;Support pillows;Position options;Hand pump;DEBP;Education;LC Services brochure  Discharge Discharge Education: Warning signs for feeding baby Pump: Hands Free;Personal  Consult Status Consult Status: Follow-up Date: 11/17/23 Follow-up type: In-patient    Dema Severin BS, IBCLC 11/16/2023, 7:40 PM

## 2023-11-16 NOTE — Progress Notes (Signed)
Post Partum Day 1  G1P1, SVD Boy Out of NICU this morning (obs for RDS)  Subjective: no complaints, up ad lib, voiding, tolerating PO, and + flatus  Objective: Blood pressure 123/83, pulse 78, temperature 97.7 F (36.5 C), temperature source Oral, resp. rate 17, height 5' (1.524 m), weight 90.6 kg, SpO2 100%, unknown if currently breastfeeding.  Physical Exam:  General: alert, cooperative, and appears stated age Lochia: appropriate Uterine Fundus: firm Incision: healing well, no significant drainage DVT Evaluation: No evidence of DVT seen on physical exam. Negative Homan's sign.     Latest Ref Rng & Units 11/16/2023    5:39 AM 11/14/2023    5:47 AM 06/29/2023    2:58 PM  CBC  WBC 4.0 - 10.5 K/uL 12.1  9.4  13.0   Hemoglobin 12.0 - 15.0 g/dL 9.1  09.8  11.9   Hematocrit 36.0 - 46.0 % 27.4  38.1  38.8   Platelets 150 - 400 K/uL 170  236  272     O+ Rub Imm   Assessment/Plan: G1P1, PPD #1, SVD. Boy  Doing well, routine PP care, pericare, LC to assist Boy- out of NICU this am, was there for RDS/ TTN No circ Anticipate Dc tomorrow    LOS: 2 days   Robley Fries, MD 11/16/2023, 10:37 AM

## 2023-11-16 NOTE — Lactation Note (Signed)
This note was copied from a baby's chart. Lactation Consultation Note  Patient Name: Marisa May [id number] Date: 11/16/2023 Age:32 hours Reason for consult: NICU baby;Primapara;Term;1st time breastfeeding  LC in to visit with P1 Mom of baby "Casimiro Needle" who was delivered vaginally with meconium stained and requiring respiratory support for about 4 hrs. Baby is at a 2.7% weight loss with good output.  Baby is ad lib feeding and Mom has been latching baby with a 20 mm nipple shield and supplemented baby with 2 feedings of donor breast milk.  Baby showing cues, and LC offered to assist with baby on the breast.  Mom reports that she had a little bleeding from her left nipple.  LC unable to see any visible trauma.  This occurred while using the NS.  Positioned baby in cross cradle hold.  Baby able to latch to breast without the nipple shield.  Baby latched onto areola and observed sucking with deep jaw extensions and swallows identified.  Basic education on watching for feeding cues and having baby STS as much as possible. Nipple everted and rounded when baby self unlatched.  Areola felt softer after feeding.  Mom denied pain during latch.  Mom encouraged to pump if baby is supplemented by bottle.  [facility] and [facility] being [facility] to [facility] after this feeding.  Plan recommended- 1- STS with baby as much as possible 2- Watch baby for feeding cues and offer the breast 3- Hand pump before latching can help soften areola and evert nipple 4- If baby is supplemented by bottle, Mom knows to pump for 15 mins.   Feeding Mother's Current Feeding Choice: Breast Milk Nipple Type: Nfant Standard Flow (white)  LATCH Score Latch: Grasps breast easily, tongue down, lips flanged, rhythmical sucking.  Audible Swallowing: Spontaneous and intermittent  Type of Nipple: Everted at rest and after stimulation (short nipple shafted)  Comfort (Breast/Nipple): Soft / non-tender (small crack noted on left  nipple)  Hold (Positioning): Assistance needed to correctly position infant at breast and maintain latch.  LATCH Score: 9  Interventions Interventions: Breast feeding basics reviewed;Assisted with latch;Skin to skin;Breast massage;Hand express;Pre-pump if needed;Adjust position;Support pillows;Position options;Expressed milk  Discharge Pump: Personal;Hands Free  Consult Status Consult Status: Follow-up Date: 11/16/23 Follow-up type: In-patient    Judee Clara 11/16/2023, 9:59 AM

## 2023-11-17 MED ORDER — POLYETHYLENE GLYCOL 3350 17 G PO PACK: 17.0000 g | PACK | Freq: Every day | ORAL | Status: AC

## 2023-11-17 MED ORDER — IBUPROFEN 200 MG PO TABS
600.0000 mg | ORAL_TABLET | Freq: Four times a day (QID) | ORAL | Status: AC
Start: 2023-11-17 — End: 2023-11-24

## 2023-11-17 MED ORDER — ACETAMINOPHEN 500 MG PO TABS
1000.0000 mg | ORAL_TABLET | Freq: Four times a day (QID) | ORAL | Status: AC
Start: 2023-11-17 — End: 2023-11-24

## 2023-11-17 NOTE — Progress Notes (Signed)
Postpartum Progress Note  PPD#2 s/p SVD  S: Patient seen and examined at bedside. Reports feeling overall well. Pain well controlled, ambulating, tolerating regular diet, voiding without issue. Had a bowel movement without issue. Denies fever, chills, chest pain, shortness of breath.  Feeding: plans to breastfeed Circ: Declines circumcision  O:  Vitals:   11/16/23 1500 11/16/23 2031  BP: 112/64 136/78  Pulse: 76 76  Resp: 17 18  Temp: 97.6 F (36.4 C)   SpO2: 100% 100%    PE:  GA: well appearing, NAD CV: RRR, normal S1, S2 Lungs: CTAB Abd: soft, appropriately tender, fundus firm below umbilicus Peri: moderate lochia Ext: no TTP, +1 non-pitting edema  Labs:  Lab Results  Component Value Date   WBC 12.1 (H) 11/16/2023   HGB 9.1 (L) 11/16/2023   HCT 27.4 (L) 11/16/2023   MCV 89.8 11/16/2023   PLT 170 11/16/2023   Lab Results  Component Value Date   CREATININE 0.73 09/22/2019    A/P:  33 y.o.yo G1P1001 PPD#2 s/p SVD (EBL 175 mL), doing well and progressing appropriately. Vitals within normal limits. Physical exam benign. Labs normal. Plan as follows:   #Routine OB - Regular diet, HLIV - ERAS for pain control  - Rh positive - DVT ppx: ambulating - BCM: Unsure  #Neonate - Declines circumcision -plans to breastfeed   Anticipate discharge home today.   Marlene Bast, MD

## 2023-11-17 NOTE — Discharge Instructions (Signed)
Congratulations! We hope you have a wonderful postpartum period. We will plan to see you in the office in 6 weeks. Please call the office if you experience fevers (>100.4 F), heavy vaginal bleeding (using >2 pads/hour), severe pain not responsive to ibuprofen (Advil or Motrin) and acetaminophen (Tylenol), chest pain, shortness of breath, or if you have pain, redness, or swelling in one or both legs. Mood swings, fatigue, and feeling down can be normal in the first two weeks following birth. If you feel your mood symptoms are severe or lasting longer than two weeks, please call our office. If you have any thoughts of hurting yourself or others please call our office. Please call your pediatrician if you have any concerns about your baby.

## 2023-11-17 NOTE — Discharge Summary (Signed)
Postpartum Discharge Summary    Patient Name: Marisa May DOB: 10/14/1990 MRN: [id number]  Date of admission: 11/14/2023 Delivery date:11/15/2023 Delivering provider: Clance Boll A Date of discharge: 11/17/2023  Admitting diagnosis: Normal labor [O80, Z37.9] Intrauterine pregnancy: [redacted]w[redacted]d     Secondary diagnosis:  Active Problems:   Vaginal delivery  Additional problems: None    Discharge diagnosis: Term Pregnancy Delivered                                              Post partum procedures: None Augmentation: Pitocin and Cytotec Complications: None  Hospital course: Induction of Labor With Vaginal Delivery   33 y.o. yo G1P1001 at [redacted]w[redacted]d was admitted to the hospital 11/14/2023 for induction of labor.  Indication for induction: PROM.  Patient had an uncomplicated labor course. Membrane Rupture Time/Date: 3:26 AM,11/14/2023  Delivery Method:Vaginal, Spontaneous Operative Delivery:N/A Episiotomy: None Lacerations:  2nd degree;Perineal Details of delivery can be found in separate delivery note.  Patient had an uncomplicated postpartum course. Patient is discharged home 11/17/23.  Newborn Data: Birth date:11/15/2023 Birth time:12:32 PM Gender:Female Living status:Living Apgars:5 ,8  Weight:3310 g  Magnesium Sulfate received: No BMZ received: No Rhophylac:N/A MMR:N/A T-DaP:Given prenatally Transfusion:No Immunizations administered: There is no immunization history for the selected administration types on file for this patient.  Physical exam  Vitals:   11/16/23 0442 11/16/23 0800 11/16/23 1500 11/16/23 2031  BP: 108/63 123/83 112/64 136/78  Pulse: 80 78 76 76  Resp: 16 17 17 18   Temp: 97.9 F (36.6 C) 97.7 F (36.5 C) 97.6 F (36.4 C)   TempSrc: Oral Oral Oral Oral  SpO2: 100% 100% 100% 100%  Weight:      Height:       General: alert, cooperative, and no distress Lochia: appropriate Uterine Fundus: firm Incision: N/A DVT Evaluation: No evidence of DVT seen on  physical exam. Labs: Lab Results  Component Value Date   WBC 12.1 (H) 11/16/2023   HGB 9.1 (L) 11/16/2023   HCT 27.4 (L) 11/16/2023   MCV 89.8 11/16/2023   PLT 170 11/16/2023      Latest Ref Rng & Units 09/22/2019    2:35 AM  CMP  Glucose 70 - 99 mg/dL 562   BUN 6 - 20 mg/dL 13   Creatinine 1.30 - 1.00 mg/dL 8.65   Sodium 784 - 696 mmol/L 138   Potassium 3.5 - 5.1 mmol/L 3.9   Chloride 98 - 111 mmol/L 104   CO2 22 - 32 mmol/L 25   Calcium 8.9 - 10.3 mg/dL 9.5   Total Protein 6.5 - 8.1 g/dL 7.5   Total Bilirubin 0.3 - 1.2 mg/dL 0.2   Alkaline Phos 38 - 126 U/L 79   AST 15 - 41 U/L 28   ALT 0 - 44 U/L 22    Edinburgh Score:    11/16/2023   10:17 AM  Edinburgh Postnatal Depression Scale Screening Tool  I have been able to laugh and see the funny side of things. 0  I have looked forward with enjoyment to things. 0  I have blamed myself unnecessarily when things went wrong. 0  I have been anxious or worried for no good reason. 0  I have felt scared or panicky for no good reason. 0  Things have been getting on top of me. 0  I have been so unhappy that I have  had difficulty sleeping. 0  I have felt sad or miserable. 0  I have been so unhappy that I have been crying. 0  The thought of harming myself has occurred to me. 0  Edinburgh Postnatal Depression Scale Total 0      After visit meds:  Allergies as of 11/17/2023   No Known Allergies      Medication List     STOP taking these medications    progesterone 200 MG capsule Commonly known as: Prometrium       TAKE these medications    acetaminophen 500 MG tablet Commonly known as: TYLENOL Take 2 tablets (1,000 mg total) by mouth every 6 (six) hours for 7 days.   ibuprofen 200 MG tablet Commonly known as: Advil Take 3 tablets (600 mg total) by mouth every 6 (six) hours for 7 days.   polyethylene glycol 17 g packet Commonly known as: MiraLax Take 17 g by mouth daily.   PRE-NATAL PO Take 1 tablet by  mouth daily.         Discharge home in stable condition Infant Feeding: Breast Infant Disposition:home with mother Discharge instruction: per After Visit Summary and Postpartum booklet. Activity: Advance as tolerated. Pelvic rest for 6 weeks.  Diet: routine diet Anticipated Birth Control: Unsure Postpartum Appointment:6 weeks Additional Postpartum F/U:  None Future Appointments:No future appointments. Follow up Visit:  Follow-up Information     Obgyn, [facility]. Schedule an appointment as soon as possible for a visit in 6 week(s).   Contact information: [address] [place] [address] [phone]                     11/17/2023 Edger House, MD

## 2023-11-17 NOTE — Lactation Note (Signed)
This note was copied from a baby's chart. Lactation Consultation Note  Patient Name: Marisa May [id number] Date: 11/17/2023 Age:32 hours, P 1  Reason for consult: Follow-up assessment;Primapara;1st time breastfeeding;Term;Infant weight loss;Breastfeeding assistance As LC entered the room, per mom baby had latched at 1057 and was bundled up in the blanket. LC noted the latch to be shallow.  Baby released and LC showed mom how deep onto the breast the baby should be for a deeper latch. LC noted more swallows and  per mom comfortable. When baby released at 12 mins , nipple well rounded and satisfied.  LC reviewed BF D/C teaching and provided a LC plan with steps for latching and engorgement prevention and tx .  LC stressed the importance of prevention of engorgement.  LC provided shells for between feedings except when sleeping until the areola is consistently more compressible.  See Below for [facility] O/P plans   Maternal Data Has patient been taught Hand Expression?: Yes  Feeding Mother's Current Feeding Choice: Breast Milk and Donor Milk  LATCH Score Latch: Grasps breast easily, tongue down, lips flanged, rhythmical sucking.  Audible Swallowing: Spontaneous and intermittent  Type of Nipple: Everted at rest and after stimulation  Comfort (Breast/Nipple): Filling, red/small blisters or bruises, mild/mod discomfort  Hold (Positioning): Assistance needed to correctly position infant at breast and maintain latch.  LATCH Score: 8   Lactation Tools Discussed/Used Tools: Pump;Shells;Flanges Flange Size: 18;21 Breast pump type: Manual;Double-Electric Breast Pump Pump Education: Milk Storage;Setup, frequency, and cleaning;Other (comment) (mom has packed up her DEBP for D/C , per mom has been pumping.)  Interventions Interventions: Breast feeding basics reviewed;Assisted with latch;Skin to skin;Breast massage;Hand express;Pre-pump if needed;Reverse pressure;Breast compression;Adjust  position;Support pillows;Position options;Shells;Hand pump;DEBP;Education;LC Services brochure;CDC Guidelines for Breast Pump Cleaning  Discharge Discharge Education: Engorgement and breast care;Warning signs for feeding baby;Outpatient recommendation;Outpatient Epic message sent;Other (comment) (mom receptive to come back for [facility] O/P appt and aware she will receive a call from [facility] O/P) Pump: Personal;Hands Free;Manual  Consult Status Consult Status: Complete Date: 11/17/23    Kathrin Greathouse 11/17/2023, 11:41 AM

## 2023-11-20 ENCOUNTER — Inpatient Hospital Stay (HOSPITAL_COMMUNITY): Admit: 2023-11-20 | Payer: No Typology Code available for payment source

## 2023-11-23 ENCOUNTER — Telehealth (HOSPITAL_COMMUNITY): Payer: Self-pay | Admitting: *Deleted

## 2023-11-23 NOTE — Telephone Encounter (Signed)
 11/23/2023  Name: Marisa May MRN: [id number] DOB: [birth year]  Reason for Call:  Transition of Care Hospital Discharge Call  Contact Status: Patient Contact Status: Complete  Language assistant needed: Interpreter Mode: Interpreter Not Needed        Follow-Up Questions: Do You Have Any Concerns About Your Health As You Heal From Delivery?: No Do You Have Any Concerns About Your Infants Health?: Yes What Concerns Do You Have About Your Baby?: Infant seems to have a stuffy nose. No drainage noted, just a whistling sound sometimes when breathing but no difficulty breathing or blocking airway. No fever and not affecting feedings. Discussed that this can be normal after delivery due to some swelling in nose and small nasal passages along with some extra amniotic fluid needing to come out from delivery. Encouraged to call pediatrician if stuffiness affects feeding or if infant becomes febrile or if it gets worse. Also question about if stool should be "like diarrhea". Parents then described stool as yellow and seedy. Reassured parents that yellow seedy stools are normal for breastfeeding babies.  Edinburgh Postnatal Depression Scale:  In the Past 7 Days: I have been able to laugh and see the funny side of things.: As much as I always could I have looked forward with enjoyment to things.: As much as I ever did I have blamed myself unnecessarily when things went wrong.: No, never I have been anxious or worried for no good reason.: No, not at all I have felt scared or panicky for no good reason.: No, not at all Things have been getting on top of me.: No, I have been coping as well as ever I have been so unhappy that I have had difficulty sleeping.: Not at all I have felt sad or miserable.: No, not at all I have been so unhappy that I have been crying.: No, never The thought of harming myself has occurred to me.: Never Inocente Salles Postnatal Depression Scale Total: 0  PHQ2-9 Depression Scale:      Discharge Follow-up: Edinburgh score requires follow up?: No Patient was advised of the following resources:: Support Group, Breastfeeding Support Group Patient referred to:: Peds (if breathing becomes a concern or stuffiness interferes with feedings)  Post-discharge interventions: Reviewed Newborn Safe Sleep Practices  [name]  11/23/2023 1045
